# Patient Record
Sex: Male | Born: 1975 | Race: Black or African American | Hispanic: No | Marital: Married | Smoking: Current every day smoker
Health system: Southern US, Community
[De-identification: ages and names within clinical notes are randomized; demographics above are authoritative.]

## PROBLEM LIST (undated history)

## (undated) DIAGNOSIS — E119 Type 2 diabetes mellitus without complications: Secondary | ICD-10-CM

## (undated) DIAGNOSIS — E785 Hyperlipidemia, unspecified: Secondary | ICD-10-CM

## (undated) DIAGNOSIS — I10 Essential (primary) hypertension: Secondary | ICD-10-CM

## (undated) HISTORY — PX: TONSILLECTOMY: SUR1361

## (undated) HISTORY — PX: ADENOIDECTOMY: SUR15

---

## 2006-05-08 ENCOUNTER — Emergency Department (HOSPITAL_COMMUNITY): Admission: EM | Admit: 2006-05-08 | Discharge: 2006-05-08 | Payer: Self-pay | Admitting: Emergency Medicine

## 2010-03-09 ENCOUNTER — Emergency Department (HOSPITAL_COMMUNITY): Admission: EM | Admit: 2010-03-09 | Discharge: 2010-03-09 | Payer: Self-pay | Admitting: Emergency Medicine

## 2010-03-18 ENCOUNTER — Ambulatory Visit (HOSPITAL_COMMUNITY)
Admission: RE | Admit: 2010-03-18 | Discharge: 2010-03-18 | Payer: Self-pay | Admitting: Physical Medicine and Rehabilitation

## 2010-09-17 ENCOUNTER — Ambulatory Visit: Payer: Self-pay | Admitting: Gastroenterology

## 2010-09-17 DIAGNOSIS — K5732 Diverticulitis of large intestine without perforation or abscess without bleeding: Secondary | ICD-10-CM | POA: Insufficient documentation

## 2010-10-12 ENCOUNTER — Ambulatory Visit: Payer: Self-pay | Admitting: Gastroenterology

## 2010-10-12 ENCOUNTER — Ambulatory Visit (HOSPITAL_COMMUNITY): Admission: RE | Admit: 2010-10-12 | Discharge: 2010-10-12 | Payer: Self-pay | Admitting: Gastroenterology

## 2010-10-16 ENCOUNTER — Encounter: Payer: Self-pay | Admitting: Gastroenterology

## 2011-01-26 NOTE — Letter (Signed)
Summary: TCS ORDER  TCS ORDER   Imported By: Ave Filter 09/17/2010 15:08:53  _____________________________________________________________________  External Attachment:    Type:   Image     Comment:   External Document

## 2011-01-26 NOTE — Assessment & Plan Note (Signed)
Summary: DIVERTICULITIS #1   Visit Type:  Initial Consult Primary Care Provider:  Barbara Cower, M.D.  Chief Complaint:  diverticulitis #1.  History of Present Illness: Went to ED with R sided abd pain 2 weeks ago. Rx: CIP/?FLAG for 10 days. No nausea, vomtiing, fever, or chills. Was a little constipated. No diarrhea. No ASA, BC, Goody's, Ibuprofen, or Motrin. Rare ALeve for HAs. No problems swallowing, or heartburn. Weight loss: 7 lbs intentionally. Appetite: stays hungry. Been on a liquid diet and now eating food.  [facility] BM: 2x/day.  Preventive Screening-Counseling & Management  Alcohol-Tobacco     Smoking Status: current  Current Medications (verified): 1)  Losartan Potassium-Hctz 100-12.5 Mg Tabs (Losartan Potassium-Hctz) .... Take 1 Tablet By Mouth Once A Day 2)  Amlodipine Besylate 5 Mg Tabs (Amlodipine Besylate) .... Take 1 Tablet By Mouth Once A Day 3)  Lortab 5-500 Mg Tabs (Hydrocodone-Acetaminophen) .... Take One-Two Tablets Every 4 Hours As Needed 4)  Cipro 500 Mg Tabs (Ciprofloxacin Hcl) .... Take 1 Tablet By Mouth Two Times A Day  Allergies (verified): No Known Drug Allergies  Past History:  Past Medical History: Hypertension  Past Surgical History: Tonsillectomy/adenoids Wisdom Teeth extraction  Family History: No FH of Colon Cancer or polyps  Social History: Relationship for 9 years No kids Patient currently smokes. 1pk q2 days and now 1 cig/day. Alcohol Use - yes-1x/q6 mos Occupation: drives fork lift Smoking Status:  current  Review of Systems       Per HPI otherwise all systems negative.  Vital Signs:  Patient profile:   35 year old male Height:      72 inches Weight:      324 pounds BMI:     44.10 Temp:     98.0 degrees F oral Pulse rate:   80 / minute BP sitting:   134 / 80  (left arm) Cuff size:   regular  Vitals Entered By: West Bali MD (September 17, 2010 3:05 PM)  Nutrition Counseling: Patient's BMI is greater than 25 and therefore  counseled on weight management options.  Physical Exam  General:  Well developed, well nourished, no acute distress. Head:  Normocephalic and atraumatic. Eyes:  PERRL, no icterus. Mouth:  No deformity or lesions. Neck:  Supple; no masses. Lungs:  Clear throughout to auscultation. Heart:  Regular rate and rhythm; no murmurs. Abdomen:  Soft, nontender and nondistended. Normal bowel sounds. Extremities:  No edema noted. Neurologic:  Alert and  oriented x4;  grossly normal neurologically.  Impression & Recommendations:  Problem # 1:  DIVERTICULITIS, ACUTE (ICD-562.11) Assessment Improved Pt report right sided pain/?diverticulitis. CT not available. Obtainreport. Complete CIP. TCS in 2-3 weeks. OPV after TCS.  CC: PCP  Appended Document: Orders Update    Clinical Lists Changes  Orders: Added new Service order of Consultation Level III [id number]) - Signed      Appended Document: DIVERTICULITIS #1 [facility] SEP 2011 CT A/P W/ CM THKND CECUM, NL TI  Appended Document: DIVERTICULITIS #27 Aug 2010 CR 1.26 HB 14.8 PLT 194

## 2011-01-26 NOTE — Letter (Signed)
Summary: WORK NOTE  WORK NOTE   Imported By: Rexene Alberts 10/16/2010 09:07:50  _____________________________________________________________________  External Attachment:    Type:   Image     Comment:   External Document

## 2011-05-16 ENCOUNTER — Emergency Department (HOSPITAL_COMMUNITY)
Admission: EM | Admit: 2011-05-16 | Discharge: 2011-05-16 | Discharge status: Against Medical Advice | Payer: Self-pay | Attending: Emergency Medicine | Admitting: Emergency Medicine

## 2011-05-16 DIAGNOSIS — M549 Dorsalgia, unspecified: Secondary | ICD-10-CM | POA: Insufficient documentation

## 2011-05-16 DIAGNOSIS — X58XXXA Exposure to other specified factors, initial encounter: Secondary | ICD-10-CM | POA: Insufficient documentation

## 2011-05-16 DIAGNOSIS — T148XXA Other injury of unspecified body region, initial encounter: Secondary | ICD-10-CM | POA: Insufficient documentation

## 2013-01-19 ENCOUNTER — Encounter (HOSPITAL_COMMUNITY): Payer: Self-pay | Admitting: *Deleted

## 2013-01-19 ENCOUNTER — Emergency Department (HOSPITAL_COMMUNITY)
Admission: EM | Admit: 2013-01-19 | Discharge: 2013-01-19 | Disposition: A | Discharge status: Home / Self Care | Payer: Self-pay | Attending: Emergency Medicine | Admitting: Emergency Medicine

## 2013-01-19 DIAGNOSIS — F17200 Nicotine dependence, unspecified, uncomplicated: Secondary | ICD-10-CM | POA: Insufficient documentation

## 2013-01-19 DIAGNOSIS — Z79899 Other long term (current) drug therapy: Secondary | ICD-10-CM | POA: Insufficient documentation

## 2013-01-19 DIAGNOSIS — J069 Acute upper respiratory infection, unspecified: Secondary | ICD-10-CM | POA: Insufficient documentation

## 2013-01-19 DIAGNOSIS — I10 Essential (primary) hypertension: Secondary | ICD-10-CM | POA: Insufficient documentation

## 2013-01-19 HISTORY — DX: Essential (primary) hypertension: I10

## 2013-01-19 NOTE — ED Notes (Signed)
Pt alert & oriented x4, stable gait. Patient given discharge instructions, paperwork & prescription(s). Patient  instructed to stop at the registration desk to finish any additional paperwork. Patient verbalized understanding. Pt left department w/ no further questions. 

## 2013-01-19 NOTE — ED Notes (Signed)
Fever intermittently since Saturday ,now sore throat.  Cough,  Chills,  Body aches

## 2013-01-19 NOTE — ED Provider Notes (Signed)
History   This chart was scribed for Flint Melter, MD, by Frederik Pear, ER scribe. The patient was seen in room APA04/APA04 and the patient's care was started at 2225.    CSN: [id number]  Arrival date & time 01/19/13  2155   First MD Initiated Contact with Patient 01/19/13 2225      Chief Complaint  Patient presents with  . Sore Throat    (Consider location/radiation/quality/duration/timing/severity/associated sxs/prior treatment) HPI Grant Anderson is a 37 y.o. male with a h/o of hypertension who presents to the Emergency Department complaining of a constant, sudden onset, moderate sore throat with pain and difficulty swallowing with associated coughing, intermittent low-grade fever, chills, and myalgias that began 7 days ago. He denies any sneezing for facial pain and swelling. He has a surgical h/o of a tonsillectomy. He reports several sick contacts with similar symptoms.  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 He denies having a current flu shot.  Past Medical History  Diagnosis Date  . Hypertension     Past Surgical History  Procedure Date  . Tonsillectomy   . Adenoidectomy     History reviewed. No pertinent family history.  History  Substance Use Topics  . Smoking status: Current Every Day Smoker  . Smokeless tobacco: Not on file  . Alcohol Use: Yes      Review of Systems A complete 10 system review of systems was obtained and all systems are negative except as noted in the HPI and PMH.  Allergies  Review of patient's allergies indicates no known allergies.  Home Medications   Current Outpatient Rx  Name  Route  Sig  Dispense  Refill  . ACETAMINOPHEN 500 MG PO TABS   Oral   Take 1,000 mg by mouth 3 (three) times daily as needed. For pain/fever         . CHLORPHENIRAMINE-ACETAMINOPHEN 2-325 MG PO TABS   Oral   Take 2 tablets by mouth daily as needed. For symptoms         . LOSARTAN POTASSIUM-HCTZ 100-12.5  MG PO TABS   Oral   Take 1 tablet by mouth daily.           BP 171/101  Pulse 64  Temp 99.1 F (37.3 C) (Oral)  Resp 20  Ht 6\' 2"  (1.88 m)  Wt 320 lb (145.151 kg)  BMI 41.09 kg/m2  SpO2 100%  Physical Exam  Nursing note and vitals reviewed. Constitutional: He is oriented to person, place, and time. He appears well-developed and well-nourished.  HENT:  Head: Normocephalic and atraumatic.  Right Ear: External ear normal.  Left Ear: External ear normal.  Nose: Nose normal.  Mouth/Throat: Posterior oropharyngeal erythema present.  Eyes: Conjunctivae normal and EOM are normal. Pupils are equal, round, and reactive to light.  Neck: Normal range of motion and phonation normal. Neck supple.  Cardiovascular: Normal rate, regular rhythm, normal heart sounds and intact distal pulses.   Pulmonary/Chest: Effort normal and breath sounds normal. He exhibits no bony tenderness.  Abdominal: Soft. Normal appearance. There is no tenderness.  Musculoskeletal: Normal range of motion.  Lymphadenopathy:    He has no cervical adenopathy.  Neurological: He is alert and oriented to person, place, and time. He has normal strength. No cranial nerve deficit or sensory deficit. He exhibits normal muscle tone. Coordination normal.  Skin: Skin is warm, dry and intact.  Psychiatric: He has a normal mood and affect. His behavior is normal. Judgment and  thought content normal.    ED Course  Procedures (including critical care time)  DIAGNOSTIC STUDIES: Oxygen Saturation is 100% on room air, normal by my interpretation.    COORDINATION OF CARE:  22:29- Discussed planned course of treatment with the patient, including a rapid strep test, who is agreeable at this time.    Labs Reviewed  RAPID STREP SCREEN  LAB REPORT - SCANNED   Nursing notes, applicable records and vitals reviewed.  Radiologic Images/Reports reviewed.    1. Acute URI       MDM  Evaluation consistent with URI without  complicating factors. Doubt metabolic instability, serious bacterial infection or impending vascular collapse; the patient is stable for discharge.  I personally performed the services described in this documentation, which was scribed in my presence. The recorded information has been reviewed and is accurate.     Plan: Home Medications- OTC analgesics; Home Treatments- rest until; Recommended follow up- PCP in 3-4 days, if not, improved        Flint Melter, MD 01/21/13 2340

## 2013-05-21 ENCOUNTER — Emergency Department (HOSPITAL_COMMUNITY)
Admission: EM | Admit: 2013-05-21 | Discharge: 2013-05-21 | Disposition: A | Discharge status: Home / Self Care | Payer: Self-pay | Attending: Emergency Medicine | Admitting: Emergency Medicine

## 2013-05-21 ENCOUNTER — Encounter (HOSPITAL_COMMUNITY): Payer: Self-pay | Admitting: *Deleted

## 2013-05-21 DIAGNOSIS — Y939 Activity, unspecified: Secondary | ICD-10-CM | POA: Insufficient documentation

## 2013-05-21 DIAGNOSIS — W57XXXA Bitten or stung by nonvenomous insect and other nonvenomous arthropods, initial encounter: Secondary | ICD-10-CM | POA: Insufficient documentation

## 2013-05-21 DIAGNOSIS — S30861A Insect bite (nonvenomous) of abdominal wall, initial encounter: Secondary | ICD-10-CM

## 2013-05-21 DIAGNOSIS — F17200 Nicotine dependence, unspecified, uncomplicated: Secondary | ICD-10-CM | POA: Insufficient documentation

## 2013-05-21 DIAGNOSIS — R509 Fever, unspecified: Secondary | ICD-10-CM | POA: Insufficient documentation

## 2013-05-21 DIAGNOSIS — Z79899 Other long term (current) drug therapy: Secondary | ICD-10-CM | POA: Insufficient documentation

## 2013-05-21 DIAGNOSIS — I10 Essential (primary) hypertension: Secondary | ICD-10-CM | POA: Insufficient documentation

## 2013-05-21 DIAGNOSIS — S30860A Insect bite (nonvenomous) of lower back and pelvis, initial encounter: Secondary | ICD-10-CM | POA: Insufficient documentation

## 2013-05-21 DIAGNOSIS — Y929 Unspecified place or not applicable: Secondary | ICD-10-CM | POA: Insufficient documentation

## 2013-05-21 MED ORDER — DOXYCYCLINE HYCLATE 100 MG PO TABS
100.0000 mg | ORAL_TABLET | Freq: Once | ORAL | Status: AC
Start: 2013-05-21 — End: 2013-05-21
  Administered 2013-05-21: 100 mg via ORAL
  Filled 2013-05-21: qty 1

## 2013-05-21 MED ORDER — MINOCYCLINE HCL 100 MG PO CAPS: 100.0000 mg | ORAL_CAPSULE | Freq: Two times a day (BID) | ORAL | Status: DC

## 2013-05-21 MED ORDER — DILTIAZEM HCL 30 MG PO TABS
ORAL_TABLET | ORAL | Status: AC
  Administered 2013-05-21: 30 mg
  Filled 2013-05-21: qty 1

## 2013-05-21 MED ORDER — LOSARTAN POTASSIUM-HCTZ 100-12.5 MG PO TABS: 1.0000 | ORAL_TABLET | Freq: Every day | ORAL | Status: DC

## 2013-05-21 NOTE — ED Notes (Signed)
Pt has small area rt mid abd that he removed a tick from. Now pt says it has been itching and he thinks may be infected

## 2013-05-21 NOTE — ED Provider Notes (Signed)
History     CSN: [id number]  Arrival date & time 05/21/13  2023   First MD Initiated Contact with Patient 05/21/13 2035      Chief Complaint  Patient presents with  . Wound Infection    (Consider location/radiation/quality/duration/timing/severity/associated sxs/prior treatment) HPI Comments: Patient sustained a tick bite about 3 days ago. The patient presents to the emergency department at this time because the area has gotten larger and redder. The patient states that this early a.m. there was some" clear stuff" coming out of one of the bite sites. The patient thinks he may have had some fever, however he did not measure 1 with a thermometer. The patient also states he had an episode of nausea vomiting on yesterday. The patient presents now for evaluation of this bite.  The history is provided by the patient.    Past Medical History  Diagnosis Date  . Hypertension     Past Surgical History  Procedure Laterality Date  . Tonsillectomy    . Adenoidectomy      No family history on file.  History  Substance Use Topics  . Smoking status: Current Every Day Smoker  . Smokeless tobacco: Not on file  . Alcohol Use: Yes      Review of Systems  Constitutional: Negative for activity change.       All ROS Neg except as noted in HPI  HENT: Negative for nosebleeds and neck pain.   Eyes: Negative for photophobia and discharge.  Respiratory: Negative for cough, shortness of breath and wheezing.   Cardiovascular: Negative for chest pain and palpitations.  Gastrointestinal: Negative for abdominal pain and blood in stool.  Genitourinary: Negative for dysuria, frequency and hematuria.  Musculoskeletal: Negative for back pain and arthralgias.  Skin: Negative.   Neurological: Negative for dizziness, seizures and speech difficulty.  Psychiatric/Behavioral: Negative for hallucinations and confusion.    Allergies  Review of patient's allergies indicates no known allergies.  Home  Medications   Current Outpatient Rx  Name  Route  Sig  Dispense  Refill  . diphenhydrAMINE (BENADRYL) 25 mg capsule   Oral   Take 25 mg by mouth every 6 (six) hours as needed for itching, allergies or sleep.         Marland Kitchen ibuprofen (ADVIL,MOTRIN) 200 MG tablet   Oral   Take 200 mg by mouth every 6 (six) hours as needed for pain.         Marland Kitchen losartan-hydrochlorothiazide (HYZAAR) 100-12.5 MG per tablet   Oral   Take 1 tablet by mouth daily.         Marland Kitchen losartan-hydrochlorothiazide (HYZAAR) 100-12.5 MG per tablet   Oral   Take 1 tablet by mouth daily.   30 tablet   0   . minocycline (MINOCIN) 100 MG capsule   Oral   Take 1 capsule (100 mg total) by mouth 2 (two) times daily.   10 capsule   0     BP 154/113  Pulse 96  Temp(Src) 98.6 F (37 C) (Oral)  Resp 20  Ht 6\' 2"  (1.88 m)  Wt 315 lb (142.883 kg)  BMI 40.43 kg/m2  SpO2 100%  Physical Exam  Nursing note and vitals reviewed. Constitutional: He is oriented to person, place, and time. He appears well-developed and well-nourished.  Non-toxic appearance.  HENT:  Head: Normocephalic.  Right Ear: Tympanic membrane and external ear normal.  Left Ear: Tympanic membrane and external ear normal.  Eyes: EOM and lids are normal. Pupils are equal,  round, and reactive to light.  Neck: Normal range of motion. Neck supple. Carotid bruit is not present.  Cardiovascular: Normal rate, regular rhythm, normal heart sounds, intact distal pulses and normal pulses.   Pulmonary/Chest: Breath sounds normal. No respiratory distress.  Abdominal: Soft. Bowel sounds are normal. There is no tenderness. There is no guarding.  There is a 3.7 cm slightly raised mildly reddened area of the right lower abdomen. There are 3 small denuded areas one with a partial scab. There is no red streaks of the abdomen.  Musculoskeletal: Normal range of motion.  Lymphadenopathy:       Head (right side): No submandibular adenopathy present.       Head (left side):  No submandibular adenopathy present.    He has no cervical adenopathy.  Neurological: He is alert and oriented to person, place, and time. He has normal strength. No cranial nerve deficit or sensory deficit.  Skin: Skin is warm and dry.  Psychiatric: He has a normal mood and affect. His speech is normal.    ED Course  Procedures (including critical care time)  Labs Reviewed - No data to display No results found.   1. Tick bite of abdomen, initial encounter   2. Hypertension       MDM  I have reviewed nursing notes, vital signs, and all appropriate lab and imaging results for this patient. Patient sustained a tick bite approximately 3 days ago. He now has 3 scabbed or denuded areas of the lower abdomen. There is some increase in redness present. The patient also had an episode of vomiting and thinks that his may have had some temperature elevation on yesterday.  The vital signs are stable with exception of the blood pressure being significantly elevated at 154/113. The patient states he has not had his medication for a few months, because he no longer has insurance and could not go see his doctor.  Plan at this time is for the patient to apply Neosporin to the affected area on the abdomen 2 times daily. He is given a prescription for minocycline 2 times daily, the patient is given a prescription for Hyzaar 100 mg/12.5 mg for his blood pressure, with very strict instructions to see his physician or followup at the health Department concerning his blood pressure.       Kathie Dike, PA-C 05/21/13 2143

## 2013-05-21 NOTE — ED Notes (Signed)
wounf on right lower abdomen after removing a tick 3 days ago, states area is getting larger

## 2013-05-21 NOTE — ED Notes (Signed)
Has a history of htn and has not taken meds recently due to lack of insurance, advised he could be seen at the health department and he could get meds for bp at [facility]

## 2013-05-21 NOTE — ED Provider Notes (Signed)
Medical screening examination/treatment/procedure(s) were performed by non-physician practitioner and as supervising physician I was immediately available for consultation/collaboration.   Arsalan Brisbin, MD 05/21/13 2235 

## 2013-10-05 ENCOUNTER — Encounter (HOSPITAL_COMMUNITY): Payer: Self-pay | Admitting: Emergency Medicine

## 2013-10-05 ENCOUNTER — Emergency Department (HOSPITAL_COMMUNITY)
Admission: EM | Admit: 2013-10-05 | Discharge: 2013-10-05 | Disposition: A | Discharge status: Home / Self Care | Payer: 59 | Attending: Emergency Medicine | Admitting: Emergency Medicine

## 2013-10-05 ENCOUNTER — Emergency Department (HOSPITAL_COMMUNITY): Payer: 59

## 2013-10-05 DIAGNOSIS — Z792 Long term (current) use of antibiotics: Secondary | ICD-10-CM | POA: Insufficient documentation

## 2013-10-05 DIAGNOSIS — Y929 Unspecified place or not applicable: Secondary | ICD-10-CM | POA: Insufficient documentation

## 2013-10-05 DIAGNOSIS — S46911A Strain of unspecified muscle, fascia and tendon at shoulder and upper arm level, right arm, initial encounter: Secondary | ICD-10-CM

## 2013-10-05 DIAGNOSIS — X500XXA Overexertion from strenuous movement or load, initial encounter: Secondary | ICD-10-CM | POA: Insufficient documentation

## 2013-10-05 DIAGNOSIS — IMO0002 Reserved for concepts with insufficient information to code with codable children: Secondary | ICD-10-CM | POA: Insufficient documentation

## 2013-10-05 DIAGNOSIS — Y9372 Activity, wrestling: Secondary | ICD-10-CM | POA: Insufficient documentation

## 2013-10-05 DIAGNOSIS — I10 Essential (primary) hypertension: Secondary | ICD-10-CM | POA: Insufficient documentation

## 2013-10-05 DIAGNOSIS — F17200 Nicotine dependence, unspecified, uncomplicated: Secondary | ICD-10-CM | POA: Insufficient documentation

## 2013-10-05 DIAGNOSIS — Z79899 Other long term (current) drug therapy: Secondary | ICD-10-CM | POA: Insufficient documentation

## 2013-10-05 MED ORDER — HYDROCODONE-ACETAMINOPHEN 5-325 MG PO TABS: ORAL_TABLET | ORAL | Status: DC

## 2013-10-05 MED ORDER — IBUPROFEN 800 MG PO TABS: 800.0000 mg | ORAL_TABLET | Freq: Three times a day (TID) | ORAL | Status: DC

## 2013-10-05 MED ORDER — IBUPROFEN 800 MG PO TABS
800.0000 mg | ORAL_TABLET | Freq: Once | ORAL | Status: AC
  Administered 2013-10-05: 800 mg via ORAL
  Filled 2013-10-05: qty 1

## 2013-10-05 NOTE — ED Notes (Signed)
Patient reports was wrestling with girlfriend approximately a week ago and heard elbow pop. Reports pain since then to right elbow.

## 2013-10-05 NOTE — ED Notes (Signed)
Patient reports history of hypertension and forgot to take blood pressure medication today.

## 2013-10-05 NOTE — ED Notes (Signed)
Pain rt elbow, Pain increases with movement.  No swelling,  Says he was " wrestling " with his girlfriend and injured his elbow.

## 2013-10-05 NOTE — ED Provider Notes (Signed)
CSN: [id number]     Arrival date & time 10/05/13  2027 History   First MD Initiated Contact with Patient 10/05/13 2035     Chief Complaint  Patient presents with  . Elbow Pain   (Consider location/radiation/quality/duration/timing/severity/associated sxs/prior Treatment) HPI Comments: EYDAN CHIANESE is a 37 y.o. male who presents to the Emergency Department complaining of right elbow pain for one week. He states the pain began when he was forced playing with his girlfriend and heard and felt a" pop" to his elbow. Pain is worse with full extension and full flexion and improves with rest. He is trying over the counter medication without relief. He also states that he does a lot of heavy lifting and physical work. He denies swelling, redness, skin injury or rash, numbness or weakness to the right arm, wrist pain or shoulder pain.   The history is provided by the patient.    Past Medical History  Diagnosis Date  . Hypertension    Past Surgical History  Procedure Laterality Date  . Tonsillectomy    . Adenoidectomy     History reviewed. No pertinent family history. History  Substance Use Topics  . Smoking status: Current Every Day Smoker  . Smokeless tobacco: Not on file  . Alcohol Use: Yes     Comment: occasional    Review of Systems  Constitutional: Negative for fever and chills.  Respiratory: Negative for chest tightness and shortness of breath.   Cardiovascular: Negative for chest pain.  Musculoskeletal: Positive for arthralgias. Negative for joint swelling, neck pain and neck stiffness.  Skin: Negative for color change and wound.  Neurological: Negative for dizziness, weakness and numbness.  All other systems reviewed and are negative.    Allergies  Review of patient's allergies indicates no known allergies.  Home Medications   Current Outpatient Rx  Name  Route  Sig  Dispense  Refill  . diphenhydrAMINE (BENADRYL) 25 mg capsule   Oral   Take 25 mg by mouth  every 6 (six) hours as needed for itching, allergies or sleep.         Marland Kitchen ibuprofen (ADVIL,MOTRIN) 200 MG tablet   Oral   Take 200 mg by mouth every 6 (six) hours as needed for pain.         Marland Kitchen losartan-hydrochlorothiazide (HYZAAR) 100-12.5 MG per tablet   Oral   Take 1 tablet by mouth daily.         Marland Kitchen losartan-hydrochlorothiazide (HYZAAR) 100-12.5 MG per tablet   Oral   Take 1 tablet by mouth daily.   30 tablet   0   . minocycline (MINOCIN) 100 MG capsule   Oral   Take 1 capsule (100 mg total) by mouth 2 (two) times daily.   10 capsule   0    BP 162/106  Pulse 72  Temp(Src) 98.3 F (36.8 C) (Oral)  Resp 18  Ht 6\' 2"  (1.88 m)  Wt 298 lb (135.172 kg)  BMI 38.24 kg/m2  SpO2 99% Physical Exam  Nursing note and vitals reviewed. Constitutional: He is oriented to person, place, and time. He appears well-developed and well-nourished. No distress.  HENT:  Head: Normocephalic and atraumatic.  Neck: Normal range of motion. Neck supple.  Cardiovascular: Normal rate, regular rhythm, normal heart sounds and intact distal pulses.   No murmur heard. Pulmonary/Chest: Effort normal and breath sounds normal. No respiratory distress. He exhibits no tenderness.  Musculoskeletal: Normal range of motion. He exhibits tenderness. He exhibits no edema.  Right elbow: He exhibits normal range of motion, no swelling, no effusion, no deformity and no laceration. Tenderness found. Medial epicondyle tenderness noted.       Arms: Localized tenderness to palpation of the medial  epicondyle of the right elbow. Pain is reproduced with full flexion and full extension. Radial pulse is brisk, distal sensation intact, finger opposition is normal. Right wrist and shoulder are nontender. No edema or excessive warmth of the joint.  Neurological: He is alert and oriented to person, place, and time. He exhibits normal muscle tone. Coordination normal.  Skin: Skin is warm and dry.    ED Course    Procedures (including critical care time) Labs Review Labs Reviewed - No data to display   Imaging Review   Dg Elbow Complete Right  10/05/2013   CLINICAL DATA:  Right elbow pain after injury.  EXAM: RIGHT ELBOW - COMPLETE 3+ VIEW  COMPARISON:  None.  FINDINGS: There is no evidence of fracture, dislocation, or joint effusion. There is no evidence of arthropathy or other focal bone abnormality. Soft tissues are unremarkable  IMPRESSION: Negative.   Electronically Signed   By: Roque Lias M.D.   On: 10/05/2013 21:07        MDM   Localized tenderness of the right elbow secondary to injury. No concerning symptoms for septic joint. Neurovascularly intact. X-rays were reviewed and discussed with the patient. He agrees to symptomatic treatment and orthopedic followup if needed.  Patient is hypertensive this evening and takes Hyzaar for his blood pressure but has not taken his medication today. He denies any chest pain, dyspnea, headaches, or dizziness. Patient was advised of the importance of taking his medications daily and risks of uncontrolled hypertension.  Trayven Lumadue L. Trisha Mangle, PA-C 10/05/13 2120

## 2013-10-06 NOTE — ED Provider Notes (Signed)
Medical screening examination/treatment/procedure(s) were performed by non-physician practitioner and as supervising physician I was immediately available for consultation/collaboration.    Laveda Demedeiros D Naylee Frankowski, MD 10/06/13 0007 

## 2014-08-01 ENCOUNTER — Emergency Department (HOSPITAL_COMMUNITY)
Admission: EM | Admit: 2014-08-01 | Discharge: 2014-08-01 | Disposition: A | Discharge status: Home / Self Care | Payer: 59 | Attending: Emergency Medicine | Admitting: Emergency Medicine

## 2014-08-01 ENCOUNTER — Encounter (HOSPITAL_COMMUNITY): Payer: Self-pay | Admitting: Emergency Medicine

## 2014-08-01 DIAGNOSIS — J3489 Other specified disorders of nose and nasal sinuses: Secondary | ICD-10-CM | POA: Insufficient documentation

## 2014-08-01 DIAGNOSIS — I10 Essential (primary) hypertension: Secondary | ICD-10-CM | POA: Insufficient documentation

## 2014-08-01 DIAGNOSIS — H539 Unspecified visual disturbance: Secondary | ICD-10-CM | POA: Insufficient documentation

## 2014-08-01 DIAGNOSIS — H579 Unspecified disorder of eye and adnexa: Secondary | ICD-10-CM

## 2014-08-01 DIAGNOSIS — Z79899 Other long term (current) drug therapy: Secondary | ICD-10-CM | POA: Insufficient documentation

## 2014-08-01 DIAGNOSIS — F17200 Nicotine dependence, unspecified, uncomplicated: Secondary | ICD-10-CM | POA: Insufficient documentation

## 2014-08-01 MED ORDER — LOSARTAN POTASSIUM-HCTZ 100-12.5 MG PO TABS: 1.0000 | ORAL_TABLET | Freq: Every day | ORAL | Status: AC

## 2014-08-01 NOTE — ED Notes (Signed)
Pt c/o head/nasal congestion x 2 days. Denies cough. Pt states he sees flashes of light when he sneezes and states he has not taken his blood pressure medication x 2 weeks.

## 2014-08-01 NOTE — ED Provider Notes (Signed)
CSN: [id number]     Arrival date & time 08/01/14  [id number] History   First MD Initiated Contact with Patient 08/01/14 [phone]     Chief Complaint  Patient presents with  . Nasal Congestion     (Consider location/radiation/quality/duration/timing/severity/associated sxs/prior Treatment) Patient is a 38 y.o. male presenting with eye problem.  Eye Problem Location:  Both Quality: blurring. Severity:  Moderate Onset quality:  Gradual Duration:  2 days Timing:  Constant Progression:  Unchanged Chronicity:  New Context: not direct trauma and not foreign body   Relieved by:  Nothing Worsened by:  Nothing tried Ineffective treatments:  None tried Associated symptoms: blurred vision   Associated symptoms: no decreased vision, no double vision, no foreign body sensation, no headaches, no nausea, no numbness, no photophobia, no swelling, no tearing, no tingling and no vomiting   Risk factors: recent URI     Past Medical History  Diagnosis Date  . Hypertension    Past Surgical History  Procedure Laterality Date  . Tonsillectomy    . Adenoidectomy     No family history on file. History  Substance Use Topics  . Smoking status: Current Every Day Smoker -- 0.25 packs/day    Types: Cigarettes  . Smokeless tobacco: Not on file  . Alcohol Use: Yes     Comment: occasional    Review of Systems  Constitutional: Negative for fever and chills.  HENT: Negative for congestion, rhinorrhea and sore throat.   Eyes: Positive for blurred vision. Negative for double vision, photophobia and visual disturbance.  Respiratory: Negative for cough and shortness of breath.   Cardiovascular: Negative for chest pain and leg swelling.  Gastrointestinal: Negative for nausea, vomiting, abdominal pain, diarrhea and constipation.  Endocrine: Negative for polydipsia and polyuria.  Genitourinary: Negative for dysuria and hematuria.  Musculoskeletal: Negative for arthralgias and back pain.  Skin: Negative for color  change and rash.  Neurological: Negative for dizziness, tingling, syncope, light-headedness, numbness and headaches.  Hematological: Negative for adenopathy. Does not bruise/bleed easily.  All other systems reviewed and are negative.     Allergies  Review of patient's allergies indicates no known allergies.  Home Medications   Prior to Admission medications   Medication Sig Start Date End Date Taking? Authorizing Provider  ibuprofen (ADVIL,MOTRIN) 200 MG tablet Take 400 mg by mouth every 6 (six) hours as needed for fever.   Yes Historical Provider, MD  losartan-hydrochlorothiazide (HYZAAR) 100-12.5 MG per tablet Take 1 tablet by mouth daily. 08/01/14   Mirian MoMatthew Gentry, MD   BP 167/103  Pulse 62  Temp(Src) 98.3 F (36.8 C) (Oral)  Resp 16  Ht 6\' 3"  (1.905 m)  Wt 295 lb (133.811 kg)  BMI 36.87 kg/m2  SpO2 100% Physical Exam  Vitals reviewed. Constitutional: He is oriented to person, place, and time. He appears well-developed and well-nourished.  HENT:  Head: Normocephalic and atraumatic.  Eyes: Conjunctivae and EOM are normal.  Neck: Normal range of motion. Neck supple.  Cardiovascular: Normal rate, regular rhythm and normal heart sounds.   Pulmonary/Chest: Effort normal and breath sounds normal. No respiratory distress.  Abdominal: He exhibits no distension. There is no tenderness. There is no rebound and no guarding.  Musculoskeletal: Normal range of motion.  Neurological: He is alert and oriented to person, place, and time. He has normal strength and normal reflexes. No cranial nerve deficit or sensory deficit.  Skin: Skin is warm and dry.    ED Course  Procedures (including critical care time) Labs Review  Labs Reviewed - No data to display  Imaging Review No results found.   EKG Interpretation None      MDM   Final diagnoses:  Visual complaint    38 y.o. male  with pertinent PMH of HTN presents with blurring of vision while sneezing x2 days. Patient was in  his normal state of health until approximately 2 days ago when he began to develop URI symptoms consisting of runny nose, sneezing, and nonproductive cough. Patient denies fever, nausea, vomiting, chest pain, shortness of breath, any neurologic symptoms. Patient states that every time that he sneezes he has a sensation of mildly decreased vision and blurring.  He has been noncompliant with his hypertensive medication x6 days.  On arrival to ED vital signs as above consistent with hypertension. The patient denies any visual complaint and has no symptoms when not seizing. He states his vision is currently normal. Physical exam as above completely unremarkable. Consider likely source of symptoms to be hypertension. Patient was given strict return precautions for hypertensive urgency emergency, voiced understanding and agreed to followup. He is a with his primary care provider next Tuesday and he was provided a prescription for his antihypertensives while in the ED.  At this time do not suspect any intraocular pathology, however do feel is important that she follows up for his high blood pressure.  No signs of end organ damage at this time.    Labs and imaging as above reviewed.   1. Visual complaint         Mirian Mo, MD 08/01/14 [phone]

## 2014-08-01 NOTE — Discharge Instructions (Signed)
Blurred Vision You have been seen today complaining of blurred vision. This means you have a loss of ability to see small details.  CAUSES  Blurred vision can be a symptom of underlying eye problems, such as:  Aging of the eye (presbyopia).  Glaucoma.  Cataracts.  Eye infection.  Eye-related migraine.  Diabetes mellitus.  Fatigue.  Migraine headaches.  High blood pressure.  Breakdown of the back of the eye (macular degeneration).  Problems caused by some medications. The most common cause of blurred vision is the need for eyeglasses or a new prescription. Today in the emergency department, no cause for your blurred vision can be found. SYMPTOMS  Blurred vision is the loss of visual sharpness and detail (acuity). DIAGNOSIS  Should blurred vision continue, you should see your caregiver. If your caregiver is your primary care physician, he or she may choose to refer you to another specialist.  TREATMENT  Do not ignore your blurred vision. Make sure to have it checked out to see if further treatment or referral is necessary. SEEK MEDICAL CARE IF:  You are unable to get into a specialist so we can help you with a referral. SEEK IMMEDIATE MEDICAL CARE IF: You have severe eye pain, severe headache, or sudden loss of vision. MAKE SURE YOU:   Understand these instructions.  Will watch your condition.  Will get help right away if you are not doing well or get worse. Document Released: 12/16/2003 Document Revised: 03/06/2012 Document Reviewed: 07/17/2008 [facility] Patient Information 2015 [place], [place]. This information is not intended to replace advice given to you by your health care provider. Make sure you discuss any questions you have with your health care provider. Hypertension Hypertension, commonly called high blood pressure, is when the force of blood pumping through your arteries is too strong. Your arteries are the blood vessels that carry blood from your heart  throughout your body. A blood pressure reading consists of a higher number over a lower number, such as 110/72. The higher number (systolic) is the pressure inside your arteries when your heart pumps. The lower number (diastolic) is the pressure inside your arteries when your heart relaxes. Ideally you want your blood pressure below 120/80. Hypertension forces your heart to work harder to pump blood. Your arteries may become narrow or stiff. Having hypertension puts you at risk for heart disease, stroke, and other problems.  RISK FACTORS Some risk factors for high blood pressure are controllable. Others are not.  Risk factors you cannot control include:   Race. You may be at higher risk if you are African American.  Age. Risk increases with age.  Gender. Men are at higher risk than women before age 39 years. After age 39, women are at higher risk than men. Risk factors you can control include:  Not getting enough exercise or physical activity.  Being overweight.  Getting too much fat, sugar, calories, or salt in your diet.  Drinking too much alcohol. SIGNS AND SYMPTOMS Hypertension does not usually cause signs or symptoms. Extremely high blood pressure (hypertensive crisis) may cause headache, anxiety, shortness of breath, and nosebleed. DIAGNOSIS  To check if you have hypertension, your health care provider will measure your blood pressure while you are seated, with your arm held at the level of your heart. It should be measured at least twice using the same arm. Certain conditions can cause a difference in blood pressure between your right and left arms. A blood pressure reading that is higher than normal on  one occasion does not mean that you need treatment. If one blood pressure reading is high, ask your health care provider about having it checked again. TREATMENT  Treating high blood pressure includes making lifestyle changes and possibly taking medicine. Living a healthy lifestyle can  help lower high blood pressure. You may need to change some of your habits. Lifestyle changes may include:  Following the DASH diet. This diet is high in fruits, vegetables, and whole grains. It is low in salt, red meat, and added sugars.  Getting at least 2 hours of brisk physical activity every week.  Losing weight if necessary.  Not smoking.  Limiting alcoholic beverages.  Learning ways to reduce stress. If lifestyle changes are not enough to get your blood pressure under control, your health care provider may prescribe medicine. You may need to take more than one. Work closely with your health care provider to understand the risks and benefits. HOME CARE INSTRUCTIONS  Have your blood pressure rechecked as directed by your health care provider.   Take medicines only as directed by your health care provider. Follow the directions carefully. Blood pressure medicines must be taken as prescribed. The medicine does not work as well when you skip doses. Skipping doses also puts you at risk for problems.   Do not smoke.   Monitor your blood pressure at home as directed by your health care provider. SEEK MEDICAL CARE IF:   You think you are having a reaction to medicines taken.  You have recurrent headaches or feel dizzy.  You have swelling in your ankles.  You have trouble with your vision. SEEK IMMEDIATE MEDICAL CARE IF:  You develop a severe headache or confusion.  You have unusual weakness, numbness, or feel faint.  You have severe chest or abdominal pain.  You vomit repeatedly.  You have trouble breathing. MAKE SURE YOU:   Understand these instructions.  Will watch your condition.  Will get help right away if you are not doing well or get worse. Document Released: 12/13/2005 Document Revised: 04/29/2014 Document Reviewed: 10/05/2013 [facility] Patient Information 2015 [place], [place]. This information is not intended to replace advice given to you by your  health care provider. Make sure you discuss any questions you have with your health care provider.

## 2015-04-15 ENCOUNTER — Emergency Department (HOSPITAL_COMMUNITY)
Admission: EM | Admit: 2015-04-15 | Discharge: 2015-04-15 | Disposition: A | Discharge status: Home / Self Care | Payer: 59 | Attending: Emergency Medicine | Admitting: Emergency Medicine

## 2015-04-15 ENCOUNTER — Encounter (HOSPITAL_COMMUNITY): Payer: Self-pay | Admitting: Emergency Medicine

## 2015-04-15 DIAGNOSIS — I10 Essential (primary) hypertension: Secondary | ICD-10-CM | POA: Insufficient documentation

## 2015-04-15 DIAGNOSIS — G5602 Carpal tunnel syndrome, left upper limb: Secondary | ICD-10-CM | POA: Insufficient documentation

## 2015-04-15 DIAGNOSIS — Z79899 Other long term (current) drug therapy: Secondary | ICD-10-CM | POA: Insufficient documentation

## 2015-04-15 DIAGNOSIS — Z720 Tobacco use: Secondary | ICD-10-CM | POA: Insufficient documentation

## 2015-04-15 DIAGNOSIS — M25512 Pain in left shoulder: Secondary | ICD-10-CM

## 2015-04-15 MED ORDER — KETOROLAC TROMETHAMINE 10 MG PO TABS
10.0000 mg | ORAL_TABLET | Freq: Once | ORAL | Status: AC
Start: 2015-04-15 — End: 2015-04-15
  Administered 2015-04-15: 10 mg via ORAL
  Filled 2015-04-15: qty 1

## 2015-04-15 MED ORDER — MELOXICAM 15 MG PO TABS: 15.0000 mg | ORAL_TABLET | Freq: Every day | ORAL | Status: AC

## 2015-04-15 MED ORDER — PREDNISONE 50 MG PO TABS
60.0000 mg | ORAL_TABLET | Freq: Once | ORAL | Status: AC
  Administered 2015-04-15: 60 mg via ORAL
  Filled 2015-04-15 (×2): qty 1

## 2015-04-15 MED ORDER — DEXAMETHASONE 6 MG PO TABS: ORAL_TABLET | ORAL | Status: AC

## 2015-04-15 NOTE — Discharge Instructions (Signed)
Please call Dr. Hilda LiasKeeling for an appointment as soon as possible concerning your shoulder and possible carpal tunnel syndrome. Please use mobile daily with food. Please use Decadron 2 times daily with food. Shoulder Pain The shoulder is the joint that connects your arm to your body. Muscles and band-like tissues that connect bones to muscles (tendons) hold the joint together. Shoulder pain is felt if an injury or medical problem affects one or more parts of the shoulder. HOME CARE   Put ice on the sore area.  Put ice in a plastic bag.  Place a towel between your skin and the bag.  Leave the ice on for 15-20 minutes, 03-04 times a day for the first 2 days.  Stop using cold packs if they do not help with the pain.  If you were given something to keep your shoulder from moving (sling; shoulder immobilizer), wear it as told. Only take it off to shower or bathe.  Move your arm as little as possible, but keep your hand moving to prevent puffiness (swelling).  Squeeze a soft ball or foam pad as much as possible to help prevent swelling.  Take medicine as told by your doctor. GET HELP IF:  You have progressing new pain in your arm, hand, or fingers.  Your hand or fingers get cold.  Your medicine does not help lessen your pain. GET HELP RIGHT AWAY IF:   Your arm, hand, or fingers are numb or tingling.  Your arm, hand, or fingers are puffy (swollen), painful, or turn white or blue. MAKE SURE YOU:   Understand these instructions.  Will watch your condition.  Will get help right away if you are not doing well or get worse. Document Released: 05/31/2008 Document Revised: 04/29/2014 Document Reviewed: 06/26/2012 [facility] Patient Information 2015 [place], [place]. This information is not intended to replace advice given to you by your health care provider. Make sure you discuss any questions you have with your health care provider.  Carpal Tunnel Syndrome The carpal tunnel is an area under  the skin of the palm of your hand. Nerves, blood vessels, and strong tissues (tendons) pass through the tunnel. The tunnel can become puffy (swollen). If this happens, a nerve can be pinched in the wrist. This causes carpal tunnel syndrome.  HOME CARE  Take all medicine as told by your doctor.  If you were given a splint, wear it as told. Wear it at night or at times when your doctor told you to.  Rest your wrist from the activity that causes your pain.  Put ice on your wrist after long periods of wrist activity.  Put ice in a plastic bag.  Place a towel between your skin and the bag.  Leave the ice on for 15-20 minutes, 03-04 times a day.  Keep all doctor visits as told. GET HELP RIGHT AWAY IF:  You have new problems you cannot explain.  Your problems get worse and medicine does not help. MAKE SURE YOU:   Understand these instructions.  Will watch your condition.  Will get help right away if you are not doing well or get worse. Document Released: 12/02/2011 Document Revised: 03/06/2012 Document Reviewed: 12/02/2011 [facility] Patient Information 2015 [place], [place]. This information is not intended to replace advice given to you by your health care provider. Make sure you discuss any questions you have with your health care provider.

## 2015-04-15 NOTE — ED Provider Notes (Signed)
CSN: [id number]     Arrival date & time 04/15/15  1816 History   First MD Initiated Contact with Patient 04/15/15 1914     Chief Complaint  Patient presents with  . Shoulder Pain     (Consider location/radiation/quality/duration/timing/severity/associated sxs/prior Treatment) Patient is a 39 y.o. male presenting with shoulder pain. The history is provided by the patient.  Shoulder Pain Location:  Shoulder Shoulder location:  L shoulder Pain details:    Quality:  Aching and shooting   Radiates to:  L fingers   Severity:  Moderate   Onset quality:  Gradual   Duration:  2 weeks   Timing:  Intermittent   Progression:  Worsening Chronicity:  New Handedness:  Right-handed Dislocation: no   Prior injury to area:  No Relieved by:  Nothing Worsened by:  Movement Associated symptoms: tingling   Associated symptoms: no fever   Risk factors: no frequent fractures and no recent illness     Past Medical History  Diagnosis Date  . Hypertension    Past Surgical History  Procedure Laterality Date  . Tonsillectomy    . Adenoidectomy     History reviewed. No pertinent family history. History  Substance Use Topics  . Smoking status: Current Every Day Smoker -- 0.25 packs/day    Types: Cigarettes  . Smokeless tobacco: Not on file  . Alcohol Use: Yes     Comment: occasional    Review of Systems  Constitutional: Negative for fever.  Musculoskeletal: Positive for arthralgias.  Neurological:       Tingling left arm  All other systems reviewed and are negative.     Allergies  Review of patient's allergies indicates no known allergies.  Home Medications   Prior to Admission medications   Medication Sig Start Date End Date Taking? Authorizing Provider  ibuprofen (ADVIL,MOTRIN) 200 MG tablet Take 400 mg by mouth every 6 (six) hours as needed for fever.    Historical Provider, MD  losartan-hydrochlorothiazide (HYZAAR) 100-12.5 MG per tablet Take 1 tablet by mouth daily. 08/01/14    Mirian MoMatthew Gentry, MD   BP 149/94 mmHg  Pulse 70  Temp(Src) 98.2 F (36.8 C) (Oral)  Resp 18  Ht 6\' 2"  (1.88 m)  Wt 310 lb (140.615 kg)  BMI 39.78 kg/m2  SpO2 97% Physical Exam  Constitutional: He is oriented to person, place, and time. He appears well-developed and well-nourished.  Non-toxic appearance.  HENT:  Head: Normocephalic.  Right Ear: Tympanic membrane and external ear normal.  Left Ear: Tympanic membrane and external ear normal.  Eyes: EOM and lids are normal. Pupils are equal, round, and reactive to light.  Neck: Normal range of motion. Neck supple. Carotid bruit is not present.  Cardiovascular: Normal rate, regular rhythm, normal heart sounds, intact distal pulses and normal pulses.   Pulmonary/Chest: Breath sounds normal. No respiratory distress.  Abdominal: Soft. Bowel sounds are normal. There is no tenderness. There is no guarding.  Musculoskeletal: Normal range of motion.  There is crepitus with attempted range of motion of the left shoulder. There is no deformity of the shoulder. There is no hot joint noted. There is pain to palpation of the anterior left shoulder. There is full range of motion of the left elbow. There is a positive Tinel's sign on the left.  Full range of motion of the right upper extremity.  Lymphadenopathy:       Head (right side): No submandibular adenopathy present.       Head (left side): No submandibular  adenopathy present.    He has no cervical adenopathy.  Neurological: He is alert and oriented to person, place, and time. He has normal strength. No cranial nerve deficit or sensory deficit.  Skin: Skin is warm and dry.  Psychiatric: He has a normal mood and affect. His speech is normal.  Nursing note and vitals reviewed.   ED Course  Procedures (including critical care time) Labs Review Labs Reviewed - No data to display  Imaging Review No results found.   EKG Interpretation None      MDM  The examination suggest possible  rotator cuff irritation of the left shoulder, and positive Tinel's sign, consistent with carpal tunnel. The patient is referred to Dr. Hilda Lias for additional evaluation of this issue. Patient is fitted with a wrist splint to use until he is seen by orthopedics. The patient will use mobic daily with food.    Final diagnoses:  None    **I have reviewed nursing notes, vital signs, and all appropriate lab and imaging results for this patient.Ivery Quale, PA-C 04/15/15 1931  Raeford Razor, MD 04/15/15 [phone]

## 2015-04-15 NOTE — ED Notes (Signed)
Pain lt shoulder, no known injury, but does heavy lifting at work, tingling sensation in hand and arm. Good radial pulse and distal sensation

## 2015-04-15 NOTE — ED Notes (Signed)
Pt reports left shoulder pain x1 week. Pt reports left arm tingling x4 days. Pt denies any known injury but reports "lifts heavy things at work." no obvious deformity noted.

## 2015-05-15 ENCOUNTER — Ambulatory Visit (HOSPITAL_COMMUNITY)
Admission: RE | Admit: 2015-05-15 | Discharge: 2015-05-15 | Disposition: A | Discharge status: Home / Self Care | Payer: PRIVATE HEALTH INSURANCE | Source: Ambulatory Visit | Attending: Orthopaedic Surgery | Admitting: Orthopaedic Surgery

## 2015-05-15 ENCOUNTER — Other Ambulatory Visit (HOSPITAL_COMMUNITY): Payer: Self-pay | Admitting: Orthopaedic Surgery

## 2015-05-15 DIAGNOSIS — M25512 Pain in left shoulder: Secondary | ICD-10-CM | POA: Insufficient documentation

## 2015-05-15 DIAGNOSIS — M5412 Radiculopathy, cervical region: Secondary | ICD-10-CM | POA: Insufficient documentation

## 2015-07-12 ENCOUNTER — Emergency Department (HOSPITAL_COMMUNITY): Payer: PRIVATE HEALTH INSURANCE

## 2015-07-12 ENCOUNTER — Emergency Department (HOSPITAL_COMMUNITY)
Admission: EM | Admit: 2015-07-12 | Discharge: 2015-07-12 | Disposition: A | Discharge status: Home / Self Care | Payer: PRIVATE HEALTH INSURANCE | Attending: Emergency Medicine | Admitting: Emergency Medicine

## 2015-07-12 ENCOUNTER — Encounter (HOSPITAL_COMMUNITY): Payer: Self-pay | Admitting: Emergency Medicine

## 2015-07-12 DIAGNOSIS — Z791 Long term (current) use of non-steroidal anti-inflammatories (NSAID): Secondary | ICD-10-CM | POA: Insufficient documentation

## 2015-07-12 DIAGNOSIS — Z79899 Other long term (current) drug therapy: Secondary | ICD-10-CM | POA: Insufficient documentation

## 2015-07-12 DIAGNOSIS — I10 Essential (primary) hypertension: Secondary | ICD-10-CM | POA: Insufficient documentation

## 2015-07-12 DIAGNOSIS — Z720 Tobacco use: Secondary | ICD-10-CM | POA: Insufficient documentation

## 2015-07-12 DIAGNOSIS — J069 Acute upper respiratory infection, unspecified: Secondary | ICD-10-CM | POA: Insufficient documentation

## 2015-07-12 MED ORDER — ALBUTEROL SULFATE HFA 108 (90 BASE) MCG/ACT IN AERS
2.0000 | INHALATION_SPRAY | Freq: Once | RESPIRATORY_TRACT | Status: AC
  Administered 2015-07-12: 2 via RESPIRATORY_TRACT
  Filled 2015-07-12: qty 6.7

## 2015-07-12 MED ORDER — LORATADINE-PSEUDOEPHEDRINE ER 5-120 MG PO TB12: 1.0000 | ORAL_TABLET | Freq: Two times a day (BID) | ORAL | Status: AC

## 2015-07-12 MED ORDER — IBUPROFEN 800 MG PO TABS: 800.0000 mg | ORAL_TABLET | Freq: Three times a day (TID) | ORAL | Status: AC

## 2015-07-12 MED ORDER — IBUPROFEN 800 MG PO TABS
800.0000 mg | ORAL_TABLET | Freq: Once | ORAL | Status: AC
  Administered 2015-07-12: 800 mg via ORAL
  Filled 2015-07-12: qty 1

## 2015-07-12 MED ORDER — PROMETHAZINE-CODEINE 6.25-10 MG/5ML PO SYRP: 5.0000 mL | ORAL_SOLUTION | Freq: Four times a day (QID) | ORAL | Status: AC | PRN

## 2015-07-12 NOTE — ED Provider Notes (Signed)
CSN: [id number]     Arrival date & time 07/12/15  1119 History   First MD Initiated Contact with Patient 07/12/15 1128     Chief Complaint  Patient presents with  . Cough     (Consider location/radiation/quality/duration/timing/severity/associated sxs/prior Treatment) Patient is a 39 y.o. male presenting with cough. The history is provided by the patient.  Cough Cough characteristics:  Non-productive Severity:  Moderate Onset quality:  Gradual Duration:  1 day Timing:  Intermittent Progression:  Worsening Chronicity:  New Smoker: yes   Context: weather changes   Relieved by:  Nothing Worsened by:  Nothing tried Associated symptoms: myalgias and sinus congestion   Associated symptoms: no wheezing   Risk factors: no chemical exposure and no recent travel     Past Medical History  Diagnosis Date  . Hypertension    Past Surgical History  Procedure Laterality Date  . Tonsillectomy    . Adenoidectomy     History reviewed. No pertinent family history. History  Substance Use Topics  . Smoking status: Current Every Day Smoker -- 0.25 packs/day    Types: Cigarettes  . Smokeless tobacco: Not on file  . Alcohol Use: Yes     Comment: occasional    Review of Systems  Respiratory: Positive for cough. Negative for wheezing.   Musculoskeletal: Positive for myalgias.  All other systems reviewed and are negative.     Allergies  Review of patient's allergies indicates no known allergies.  Home Medications   Prior to Admission medications   Medication Sig Start Date End Date Taking? Authorizing Provider  dexamethasone (DECADRON) 6 MG tablet 1 po bid with food 04/15/15   Ivery QualeHobson Audrena Talaga, PA-C  ibuprofen (ADVIL,MOTRIN) 200 MG tablet Take 400 mg by mouth every 6 (six) hours as needed for fever.    Historical Provider, MD  losartan-hydrochlorothiazide (HYZAAR) 100-12.5 MG per tablet Take 1 tablet by mouth daily. 08/01/14   Mirian MoMatthew Gentry, MD  meloxicam (MOBIC) 15 MG tablet Take 1  tablet (15 mg total) by mouth daily. 04/15/15   Ivery QualeHobson Everest Brod, PA-C   BP 149/89 mmHg  Pulse 77  Temp(Src) 100 F (37.8 C)  Resp 20  Ht 6\' 2"  (1.88 m)  Wt 310 lb (140.615 kg)  BMI 39.78 kg/m2  SpO2 100% Physical Exam  Constitutional: He is oriented to person, place, and time. He appears well-developed and well-nourished.  Non-toxic appearance.  HENT:  Head: Normocephalic.  Right Ear: Tympanic membrane and external ear normal.  Left Ear: Tympanic membrane and external ear normal.  Eyes: EOM and lids are normal. Pupils are equal, round, and reactive to light.  Neck: Normal range of motion. Neck supple. Carotid bruit is not present.  Cardiovascular: Normal rate, regular rhythm, normal heart sounds, intact distal pulses and normal pulses.   Pulmonary/Chest: Breath sounds normal. No respiratory distress.  Abdominal: Soft. Bowel sounds are normal. There is no tenderness. There is no guarding.  Musculoskeletal: Normal range of motion.  Lymphadenopathy:       Head (right side): No submandibular adenopathy present.       Head (left side): No submandibular adenopathy present.    He has no cervical adenopathy.  Neurological: He is alert and oriented to person, place, and time. He has normal strength. No cranial nerve deficit or sensory deficit.  Skin: Skin is warm and dry.  Psychiatric: He has a normal mood and affect. His speech is normal.  Nursing note and vitals reviewed.   ED Course  Procedures (including critical care time)  Labs Review Labs Reviewed - No data to display  Imaging Review No results found.   EKG Interpretation None      MDM  Chest xray is negative for pneumonia or acute problem. Suspect URI. Rx for ibuprofen, claritinD and promethazine-codeine given to the patient. Pt to return to ED if any changes   Final diagnoses:  None    *I have reviewed nursing notes, vital signs, and all appropriate lab and imaging results for this patient.[address]  PA-C 07/15/15 1610  Gilda Crease, MD 07/17/15 [phone]

## 2015-07-12 NOTE — ED Notes (Signed)
Pt c./o congestion/dry cough since yesterday.

## 2015-07-12 NOTE — Discharge Instructions (Signed)
Upper Respiratory Infection, Adult An upper respiratory infection (URI) is also sometimes known as the common cold. The upper respiratory tract includes the nose, sinuses, throat, trachea, and bronchi. Bronchi are the airways leading to the lungs. Most people improve within 1 week, but symptoms can last up to 2 weeks. A residual cough may last even longer.  CAUSES Many different viruses can infect the tissues lining the upper respiratory tract. The tissues become irritated and inflamed and often become very moist. Mucus production is also common. A cold is contagious. You can easily spread the virus to others by oral contact. This includes kissing, sharing a glass, coughing, or sneezing. Touching your mouth or nose and then touching a surface, which is then touched by another person, can also spread the virus. SYMPTOMS  Symptoms typically develop 1 to 3 days after you come in contact with a cold virus. Symptoms vary from person to person. They may include:  Runny nose.  Sneezing.  Nasal congestion.  Sinus irritation.  Sore throat.  Loss of voice (laryngitis).  Cough.  Fatigue.  Muscle aches.  Loss of appetite.  Headache.  Low-grade fever. DIAGNOSIS  You might diagnose your own cold based on familiar symptoms, since most people get a cold 2 to 3 times a year. Your caregiver can confirm this based on your exam. Most importantly, your caregiver can check that your symptoms are not due to another disease such as strep throat, sinusitis, pneumonia, asthma, or epiglottitis. Blood tests, throat tests, and X-rays are not necessary to diagnose a common cold, but they may sometimes be helpful in excluding other more serious diseases. Your caregiver will decide if any further tests are required. RISKS AND COMPLICATIONS  You may be at risk for a more severe case of the common cold if you smoke cigarettes, have chronic heart disease (such as heart failure) or lung disease (such as asthma), or if  you have a weakened immune system. The very young and very old are also at risk for more serious infections. Bacterial sinusitis, middle ear infections, and bacterial pneumonia can complicate the common cold. The common cold can worsen asthma and chronic obstructive pulmonary disease (COPD). Sometimes, these complications can require emergency medical care and may be life-threatening. PREVENTION  The best way to protect against getting a cold is to practice good hygiene. Avoid oral or hand contact with people with cold symptoms. Wash your hands often if contact occurs. There is no clear evidence that vitamin C, vitamin E, echinacea, or exercise reduces the chance of developing a cold. However, it is always recommended to get plenty of rest and practice good nutrition. TREATMENT  Treatment is directed at relieving symptoms. There is no cure. Antibiotics are not effective, because the infection is caused by a virus, not by bacteria. Treatment may include:  Increased fluid intake. Sports drinks offer valuable electrolytes, sugars, and fluids.  Breathing heated mist or steam (vaporizer or shower).  Eating chicken soup or other clear broths, and maintaining good nutrition.  Getting plenty of rest.  Using gargles or lozenges for comfort.  Controlling fevers with ibuprofen or acetaminophen as directed by your caregiver.  Increasing usage of your inhaler if you have asthma. Zinc gel and zinc lozenges, taken in the first 24 hours of the common cold, can shorten the duration and lessen the severity of symptoms. Pain medicines may help with fever, muscle aches, and throat pain. A variety of non-prescription medicines are available to treat congestion and runny nose. Your caregiver   can make recommendations and may suggest nasal or lung inhalers for other symptoms.  HOME CARE INSTRUCTIONS   Only take over-the-counter or prescription medicines for pain, discomfort, or fever as directed by your  caregiver.  Use a warm mist humidifier or inhale steam from a shower to increase air moisture. This may keep secretions moist and make it easier to breathe.  Drink enough water and fluids to keep your urine clear or pale yellow.  Rest as needed.  Return to work when your temperature has returned to normal or as your caregiver advises. You may need to stay home longer to avoid infecting others. You can also use a face mask and careful hand washing to prevent spread of the virus. SEEK MEDICAL CARE IF:   After the first few days, you feel you are getting worse rather than better.  You need your caregiver's advice about medicines to control symptoms.  You develop chills, worsening shortness of breath, or brown or red sputum. These may be signs of pneumonia.  You develop yellow or brown nasal discharge or pain in the face, especially when you bend forward. These may be signs of sinusitis.  You develop a fever, swollen neck glands, pain with swallowing, or white areas in the back of your throat. These may be signs of strep throat. SEEK IMMEDIATE MEDICAL CARE IF:   You have a fever.  You develop severe or persistent headache, ear pain, sinus pain, or chest pain.  You develop wheezing, a prolonged cough, cough up blood, or have a change in your usual mucus (if you have chronic lung disease).  You develop sore muscles or a stiff neck. Document Released: 06/08/2001 Document Revised: 03/06/2012 Document Reviewed: 03/20/2014 [employer] Patient Information 2015 [employer]. This information is not intended to replace advice given to you by your health care provider. Make sure you discuss any questions you have with your health care provider.  

## 2015-09-03 ENCOUNTER — Encounter: Payer: Self-pay | Admitting: Gastroenterology

## 2016-05-04 ENCOUNTER — Other Ambulatory Visit (HOSPITAL_COMMUNITY): Payer: Self-pay | Admitting: Family Medicine

## 2016-05-04 DIAGNOSIS — M545 Low back pain: Secondary | ICD-10-CM

## 2016-05-07 ENCOUNTER — Ambulatory Visit (HOSPITAL_COMMUNITY)
Admission: RE | Admit: 2016-05-07 | Discharge: 2016-05-07 | Disposition: A | Discharge status: Home / Self Care | Payer: BLUE CROSS/BLUE SHIELD | Source: Ambulatory Visit | Attending: Family Medicine | Admitting: Family Medicine

## 2016-05-07 DIAGNOSIS — M545 Low back pain: Secondary | ICD-10-CM

## 2016-05-11 ENCOUNTER — Ambulatory Visit (HOSPITAL_COMMUNITY)
Admission: RE | Admit: 2016-05-11 | Discharge: 2016-05-11 | Disposition: A | Discharge status: Home / Self Care | Payer: BLUE CROSS/BLUE SHIELD | Source: Ambulatory Visit | Attending: Family Medicine | Admitting: Family Medicine

## 2016-05-11 ENCOUNTER — Other Ambulatory Visit (HOSPITAL_COMMUNITY): Payer: Self-pay | Admitting: Family Medicine

## 2016-05-11 DIAGNOSIS — M5127 Other intervertebral disc displacement, lumbosacral region: Secondary | ICD-10-CM | POA: Insufficient documentation

## 2016-05-11 DIAGNOSIS — M545 Low back pain: Secondary | ICD-10-CM

## 2017-06-13 ENCOUNTER — Other Ambulatory Visit: Payer: Self-pay | Admitting: Physician Assistant

## 2017-06-13 DIAGNOSIS — G8929 Other chronic pain: Secondary | ICD-10-CM

## 2017-06-13 DIAGNOSIS — M545 Low back pain: Principal | ICD-10-CM

## 2017-06-25 ENCOUNTER — Ambulatory Visit
Admission: RE | Admit: 2017-06-25 | Discharge: 2017-06-25 | Disposition: A | Discharge status: Home / Self Care | Payer: BLUE CROSS/BLUE SHIELD | Source: Ambulatory Visit | Attending: Physician Assistant | Admitting: Physician Assistant

## 2017-06-25 DIAGNOSIS — G8929 Other chronic pain: Secondary | ICD-10-CM

## 2017-06-25 DIAGNOSIS — M545 Low back pain, unspecified: Secondary | ICD-10-CM

## 2018-01-04 ENCOUNTER — Other Ambulatory Visit: Payer: Self-pay

## 2018-01-04 ENCOUNTER — Emergency Department (HOSPITAL_COMMUNITY)
Admission: EM | Admit: 2018-01-04 | Discharge: 2018-01-04 | Disposition: A | Discharge status: Home / Self Care | Payer: Self-pay | Attending: Emergency Medicine | Admitting: Emergency Medicine

## 2018-01-04 ENCOUNTER — Emergency Department (HOSPITAL_COMMUNITY): Payer: Self-pay

## 2018-01-04 ENCOUNTER — Encounter (HOSPITAL_COMMUNITY): Payer: Self-pay

## 2018-01-04 DIAGNOSIS — J209 Acute bronchitis, unspecified: Secondary | ICD-10-CM | POA: Insufficient documentation

## 2018-01-04 DIAGNOSIS — E785 Hyperlipidemia, unspecified: Secondary | ICD-10-CM | POA: Insufficient documentation

## 2018-01-04 DIAGNOSIS — F17210 Nicotine dependence, cigarettes, uncomplicated: Secondary | ICD-10-CM | POA: Insufficient documentation

## 2018-01-04 DIAGNOSIS — E119 Type 2 diabetes mellitus without complications: Secondary | ICD-10-CM | POA: Insufficient documentation

## 2018-01-04 DIAGNOSIS — Z79899 Other long term (current) drug therapy: Secondary | ICD-10-CM | POA: Insufficient documentation

## 2018-01-04 DIAGNOSIS — I10 Essential (primary) hypertension: Secondary | ICD-10-CM | POA: Insufficient documentation

## 2018-01-04 HISTORY — DX: Hyperlipidemia, unspecified: E78.5

## 2018-01-04 HISTORY — DX: Type 2 diabetes mellitus without complications: E11.9

## 2018-01-04 MED ORDER — ALBUTEROL SULFATE HFA 108 (90 BASE) MCG/ACT IN AERS: 1.0000 | INHALATION_SPRAY | Freq: Four times a day (QID) | RESPIRATORY_TRACT | 0 refills | Status: AC | PRN

## 2018-01-04 MED ORDER — ALBUTEROL SULFATE (2.5 MG/3ML) 0.083% IN NEBU
2.5000 mg | INHALATION_SOLUTION | Freq: Once | RESPIRATORY_TRACT | Status: AC
  Administered 2018-01-04: 2.5 mg via RESPIRATORY_TRACT
  Filled 2018-01-04: qty 3

## 2018-01-04 MED ORDER — BENZONATATE 100 MG PO CAPS: 100.0000 mg | ORAL_CAPSULE | Freq: Three times a day (TID) | ORAL | 0 refills | Status: AC

## 2018-01-04 NOTE — ED Triage Notes (Signed)
Pt reports cold symptoms for approx 2 weeks. Cough has increased and no longer to get any mucous up. Chest hurts from coughing. Cough keeping him up at night

## 2018-01-04 NOTE — ED Notes (Signed)
Advised to followup with PCP regarding bp

## 2018-01-04 NOTE — Discharge Instructions (Signed)
Your evaluated in the emergency department for cough and wheezing.  Your oxygenation number was normal and you also had a chest x-ray that showed no obvious pneumonia.  We are prescribing you an inhaler and a medication to take for cough.  You should follow-up with your doctor if any worsening symptoms.  Please limit your smoking while you are sick.

## 2018-01-04 NOTE — ED Provider Notes (Signed)
[facility] EMERGENCY DEPARTMENT Provider Note   CSN: [id number] Arrival date & time: 01/04/18  1238      History   Chief Complaint Chief Complaint  Patient presents with  . Cough    HPI Grant MateChristopher L Anderson is a 42 y.o. male.  HPI  42 year old male presents to the ED with a cough that has been getting worse for the last few days and associated with wheezing.  Patient started with upper respiratory infection about 2 weeks ago runny nose sore throat and a productive cough that has now settled down to this nonproductive cough that is keeping him up at night.  It was initially a fever but none recently.  He is a smoker.  He denies any chest pain abdominal pain nausea vomiting diarrhea or dysuria.  The cough is causing his chest to hurt.  Past Medical History:  Diagnosis Date  . Diabetes mellitus without complication (HCC)   . Hyperlipidemia   . Hypertension     Patient Active Problem List   Diagnosis Date Noted  . DIVERTICULITIS, ACUTE 09/17/2010    Past Surgical History:  Procedure Laterality Date  . ADENOIDECTOMY    . TONSILLECTOMY         Home Medications    Prior to Admission medications   Medication Sig Start Date End Date Taking? Authorizing Provider  acetaminophen (TYLENOL) 500 MG tablet Take 1,000 mg by mouth every 6 (six) hours as needed for fever.    [provider]  dexamethasone (DECADRON) 6 MG tablet 1 po bid with food Patient not taking: Reported on 07/12/2015 04/15/15   Ivery QualeBryant, Hobson, PA-C  ibuprofen (ADVIL,MOTRIN) 800 MG tablet Take 1 tablet (800 mg total) by mouth 3 (three) times daily. 07/12/15   Ivery QualeBryant, Hobson, PA-C  loratadine-pseudoephedrine (CLARITIN-D 12 HOUR) 5-120 MG per tablet Take 1 tablet by mouth 2 (two) times daily. 07/12/15   Ivery QualeBryant, Hobson, PA-C  losartan-hydrochlorothiazide (HYZAAR) 100-12.5 MG per tablet Take 1 tablet by mouth daily. 08/01/14   Mirian MoGentry, Matthew, MD  meloxicam (MOBIC) 15 MG tablet Take 1 tablet (15 mg total) by  mouth daily. Patient not taking: Reported on 07/12/2015 04/15/15   Ivery QualeBryant, Hobson, PA-C  Phenyleph-CPM-DM-Aspirin (ALKA-SELTZER PLUS COLD & COUGH PO) Take 2 capsules by mouth every 4 (four) hours as needed (cold/cough).    [provider]  promethazine-codeine (PHENERGAN WITH CODEINE) 6.25-10 MG/5ML syrup Take 5 mLs by mouth every 6 (six) hours as needed. 07/12/15   Ivery QualeBryant, Hobson, PA-C    Family History No family history on file.  Social History Social History   Tobacco Use  . Smoking status: Current Every Day Smoker    Packs/day: 0.25    Types: Cigarettes  Substance Use Topics  . Alcohol use: Yes    Comment: occasional  . Drug use: Yes    Types: Marijuana    Comment: last use 04/14/15     Allergies   Patient has no known allergies.   Review of Systems Review of Systems  Constitutional: Negative for fever.  HENT: Negative for sore throat.   Respiratory: Positive for cough. Negative for shortness of breath.   Cardiovascular: Negative for chest pain.  Gastrointestinal: Negative for abdominal pain.  Genitourinary: Negative for dysuria.  Skin: Negative for rash.     Physical Exam Updated Vital Signs BP (!) 160/102   Pulse 68   Temp 98.2 F (36.8 C) (Oral)   Resp 20   Ht 6\' 2"  (1.88 m)   Wt (!) 142.9 kg (315  lb)   SpO2 100%   BMI 40.44 kg/m   Physical Exam  Constitutional: He appears well-developed and well-nourished.  HENT:  Head: Normocephalic and atraumatic.  Right Ear: External ear normal.  Left Ear: External ear normal.  Nose: Nose normal.  Mouth/Throat: Oropharynx is clear and moist. No oropharyngeal exudate.  Eyes: Conjunctivae are normal.  Neck: Neck supple.  Cardiovascular: Normal rate, regular rhythm, normal heart sounds and intact distal pulses.  Pulmonary/Chest: Effort normal. He has wheezes (scattered wheeze, clears with cough).  Abdominal: Soft. There is no tenderness.  Musculoskeletal: Normal range of motion.  Neurological: He is  alert.  Skin: Skin is warm and dry.  Psychiatric: He has a normal mood and affect.  Nursing note and vitals reviewed.    ED Treatments / Results  Labs (all labs ordered are listed, but only abnormal results are displayed) Labs Reviewed - No data to display  EKG  EKG Interpretation None       Radiology Dg Chest 2 View  Result Date: 01/04/2018 CLINICAL DATA:  Cough and chest congestion for the past 2 weeks. Current smoker. EXAM: CHEST  2 VIEW COMPARISON:  Chest x-ray of July 12, 2015 FINDINGS: The lungs are adequately inflated. There is no focal infiltrate. There is no pleural effusion. The heart and pulmonary vascularity are normal. The mediastinum is normal in width. The bony thorax exhibits no acute abnormality. IMPRESSION: There is no pneumonia nor other acute cardiopulmonary abnormality. Electronically Signed   By: David  [place] M.D.   On: 01/04/2018 13:11    Procedures Procedures (including critical care time)  Medications Ordered in ED Medications  albuterol (PROVENTIL) (2.5 MG/3ML) 0.083% nebulizer solution 2.5 mg (2.5 mg Nebulization Given 01/04/18 1341)     Initial Impression / Assessment and Plan / ED Course  I have reviewed the triage vital signs and the nursing notes.  Pertinent labs & imaging results that were available during my care of the patient were reviewed by me and considered in my medical decision making (see chart for details).    42 year old smoker with recent URI here now with nonproductive cough and wheezing.  His pulse ox is normal and other than his elevated blood pressure is vitals are otherwise okay.  He is in no respiratory distress.  We will give him a neb here and he is used an MDI in the past so prescribe an MDI.  Will also prescribe him some cough medicine.  His chest x-ray here was negative for infiltrate.  Currently I do not feel he needs antibiotics.  We reviewed what to watch out for he understands to follow-up with his doctor or return if  any worsening symptoms.  Final Clinical Impressions(s) / ED Diagnoses   Final diagnoses:  Acute bronchitis, unspecified organism    ED Discharge Orders    None       Terrilee Files, MD 01/04/18 [phone]

## 2020-03-04 ENCOUNTER — Emergency Department (HOSPITAL_COMMUNITY)
Admission: EM | Admit: 2020-03-04 | Discharge: 2020-03-04 | Disposition: A | Discharge status: Home / Self Care | Payer: BLUE CROSS/BLUE SHIELD | Attending: Emergency Medicine | Admitting: Emergency Medicine

## 2020-03-04 ENCOUNTER — Emergency Department (HOSPITAL_COMMUNITY): Payer: BLUE CROSS/BLUE SHIELD

## 2020-03-04 ENCOUNTER — Other Ambulatory Visit: Payer: Self-pay

## 2020-03-04 ENCOUNTER — Encounter (HOSPITAL_COMMUNITY): Payer: Self-pay

## 2020-03-04 DIAGNOSIS — R197 Diarrhea, unspecified: Secondary | ICD-10-CM | POA: Insufficient documentation

## 2020-03-04 DIAGNOSIS — E119 Type 2 diabetes mellitus without complications: Secondary | ICD-10-CM | POA: Diagnosis not present

## 2020-03-04 DIAGNOSIS — K529 Noninfective gastroenteritis and colitis, unspecified: Secondary | ICD-10-CM | POA: Diagnosis not present

## 2020-03-04 DIAGNOSIS — F1210 Cannabis abuse, uncomplicated: Secondary | ICD-10-CM | POA: Diagnosis not present

## 2020-03-04 DIAGNOSIS — Z79899 Other long term (current) drug therapy: Secondary | ICD-10-CM | POA: Insufficient documentation

## 2020-03-04 DIAGNOSIS — I10 Essential (primary) hypertension: Secondary | ICD-10-CM | POA: Insufficient documentation

## 2020-03-04 DIAGNOSIS — Z20822 Contact with and (suspected) exposure to covid-19: Secondary | ICD-10-CM | POA: Insufficient documentation

## 2020-03-04 DIAGNOSIS — R1084 Generalized abdominal pain: Secondary | ICD-10-CM | POA: Diagnosis present

## 2020-03-04 DIAGNOSIS — F17210 Nicotine dependence, cigarettes, uncomplicated: Secondary | ICD-10-CM | POA: Insufficient documentation

## 2020-03-04 LAB — COMPREHENSIVE METABOLIC PANEL
ALT: 25 U/L (ref 0–44)
AST: 17 U/L (ref 15–41)
Albumin: 4.2 g/dL (ref 3.5–5.0)
Alkaline Phosphatase: 104 U/L (ref 38–126)
Anion gap: 9 (ref 5–15)
BUN: 15 mg/dL (ref 6–20)
CO2: 25 mmol/L (ref 22–32)
Calcium: 9.1 mg/dL (ref 8.9–10.3)
Chloride: 106 mmol/L (ref 98–111)
Creatinine, Ser: 1.26 mg/dL — ABNORMAL HIGH (ref 0.61–1.24)
GFR calc Af Amer: >60 mL/min (ref >60)
GFR calc non Af Amer: >60 mL/min (ref >60)
Glucose, Bld: 226 mg/dL — ABNORMAL HIGH (ref 70–99)
Potassium: 3.9 mmol/L (ref 3.5–5.1)
Sodium: 140 mmol/L (ref 135–145)
Total Bilirubin: 0.6 mg/dL (ref 0.3–1.2)
Total Protein: 7.5 g/dL (ref 6.5–8.1)

## 2020-03-04 LAB — CBC WITH DIFFERENTIAL/PLATELET
Abs Immature Granulocytes: 0.04 10*3/uL (ref 0.00–0.07)
Basophils Absolute: 0.1 10*3/uL (ref 0.0–0.1)
Basophils Relative: 1 %
Eosinophils Absolute: 0.7 10*3/uL — ABNORMAL HIGH (ref 0.0–0.5)
Eosinophils Relative: 9 %
HCT: 47.4 % (ref 39.0–52.0)
Hemoglobin: 15.3 g/dL (ref 13.0–17.0)
Immature Granulocytes: 1 %
Lymphocytes Relative: 26 %
Lymphs Abs: 2.2 10*3/uL (ref 0.7–4.0)
MCH: 29.4 pg (ref 26.0–34.0)
MCHC: 32.3 g/dL (ref 30.0–36.0)
MCV: 91.2 fL (ref 80.0–100.0)
Monocytes Absolute: 0.6 10*3/uL (ref 0.1–1.0)
Monocytes Relative: 7 %
Neutro Abs: 4.7 10*3/uL (ref 1.7–7.7)
Neutrophils Relative %: 56 %
Platelets: 210 10*3/uL (ref 150–400)
RBC: 5.2 MIL/uL (ref 4.22–5.81)
RDW: 13.3 % (ref 11.5–15.5)
WBC: 8.3 10*3/uL (ref 4.0–10.5)
nRBC: 0 % (ref 0.0–0.2)

## 2020-03-04 LAB — LIPASE, BLOOD: Lipase: 25 U/L (ref 11–51)

## 2020-03-04 MED ORDER — SODIUM CHLORIDE 0.9 % IV BOLUS
1000.0000 mL | Freq: Once | INTRAVENOUS | Status: AC
  Administered 2020-03-04: 1000 mL via INTRAVENOUS

## 2020-03-04 MED ORDER — LOPERAMIDE HCL 2 MG PO CAPS
4.0000 mg | ORAL_CAPSULE | Freq: Once | ORAL | Status: AC
  Administered 2020-03-04: 4 mg via ORAL
  Filled 2020-03-04: qty 2

## 2020-03-04 MED ORDER — ONDANSETRON 4 MG PO TBDP: ORAL_TABLET | ORAL | 0 refills | Status: AC

## 2020-03-04 NOTE — ED Provider Notes (Signed)
[facility] EMERGENCY DEPARTMENT Provider Note   CSN: [id number] Arrival date & time: 03/04/20  0730     History Chief Complaint  Patient presents with  . Abdominal Pain    Grant Anderson is a 44 y.o. male.  Patient complains of abdominal cramps and diarrhea and some nausea for few days.  No blood in his diarrhea  The history is provided by the patient. No language interpreter was used.  Abdominal Pain Pain location:  Generalized Pain quality: aching   Pain radiates to:  Does not radiate Pain severity:  Mild Onset quality:  Gradual Timing:  Constant Progression:  Waxing and waning Chronicity:  New Associated symptoms: diarrhea   Associated symptoms: no chest pain, no cough, no fatigue and no hematuria        Past Medical History:  Diagnosis Date  . Diabetes mellitus without complication (HCC)   . Hyperlipidemia   . Hypertension     Patient Active Problem List   Diagnosis Date Noted  . DIVERTICULITIS, ACUTE 09/17/2010    Past Surgical History:  Procedure Laterality Date  . ADENOIDECTOMY    . TONSILLECTOMY         No family history on file.  Social History   Tobacco Use  . Smoking status: Current Every Day Smoker    Packs/day: 0.25    Types: Cigarettes  . Smokeless tobacco: Never Used  Substance Use Topics  . Alcohol use: Yes    Comment: occasional  . Drug use: Yes    Types: Marijuana    Home Medications Prior to Admission medications   Medication Sig Start Date End Date Taking? Authorizing Provider  acetaminophen (TYLENOL) 500 MG tablet Take 1,000 mg by mouth every 6 (six) hours as needed for fever.    [provider]  albuterol (PROVENTIL HFA;VENTOLIN HFA) 108 (90 Base) MCG/ACT inhaler Inhale 1-2 puffs into the lungs every 6 (six) hours as needed for wheezing or shortness of breath. 01/04/18   Terrilee Files, MD  amLODipine (NORVASC) 10 MG tablet Take 10 mg by mouth daily. 12/03/19   [provider]  benazepril  (LOTENSIN) 40 MG tablet Take 40 mg by mouth daily. 12/03/19   [provider]  benzonatate (TESSALON) 100 MG capsule Take 1 capsule (100 mg total) by mouth every 8 (eight) hours. 01/04/18   Terrilee Files, MD  dexamethasone (DECADRON) 6 MG tablet 1 po bid with food Patient not taking: Reported on 07/12/2015 04/15/15   Ivery Quale, PA-C  ibuprofen (ADVIL,MOTRIN) 800 MG tablet Take 1 tablet (800 mg total) by mouth 3 (three) times daily. 07/12/15   Ivery Quale, PA-C  loratadine-pseudoephedrine (CLARITIN-D 12 HOUR) 5-120 MG per tablet Take 1 tablet by mouth 2 (two) times daily. 07/12/15   Ivery Quale, PA-C  losartan-hydrochlorothiazide (HYZAAR) 100-12.5 MG per tablet Take 1 tablet by mouth daily. 08/01/14   Mirian Mo, MD  meloxicam (MOBIC) 15 MG tablet Take 1 tablet (15 mg total) by mouth daily. Patient not taking: Reported on 07/12/2015 04/15/15   Ivery Quale, PA-C  ondansetron (ZOFRAN ODT) 4 MG disintegrating tablet 4mg  ODT q4 hours prn nausea/vomit 03/04/20   05/04/20, MD  oxyCODONE (OXY IR/ROXICODONE) 5 MG immediate release tablet Take 5 mg by mouth 4 (four) times daily. 02/18/20   [provider]  Phenyleph-CPM-DM-Aspirin (ALKA-SELTZER PLUS COLD & COUGH PO) Take 2 capsules by mouth every 4 (four) hours as needed (cold/cough).    [provider]  promethazine-codeine (PHENERGAN WITH CODEINE) 6.25-10 MG/5ML syrup  Take 5 mLs by mouth every 6 (six) hours as needed. 07/12/15   Ivery Quale, PA-C    Allergies    Patient has no known allergies.  Review of Systems   Review of Systems  Constitutional: Negative for appetite change and fatigue.  HENT: Negative for congestion, ear discharge and sinus pressure.   Eyes: Negative for discharge.  Respiratory: Negative for cough.   Cardiovascular: Negative for chest pain.  Gastrointestinal: Positive for abdominal pain and diarrhea.  Genitourinary: Negative for frequency and hematuria.  Musculoskeletal: Negative for  back pain.  Skin: Negative for rash.  Neurological: Negative for seizures and headaches.  Psychiatric/Behavioral: Negative for hallucinations.    Physical Exam Updated Vital Signs BP (!) 166/111 (BP Location: Left Arm)   Pulse 71   Temp 97.8 F (36.6 C) (Oral)   Resp 18   Ht 6\' 2"  (1.88 m)   Wt 136.1 kg   SpO2 98%   BMI 38.52 kg/m   Physical Exam Vitals and nursing note reviewed.  Constitutional:      Appearance: He is well-developed.  HENT:     Head: Normocephalic.     Mouth/Throat:     Mouth: Mucous membranes are moist.  Eyes:     General: No scleral icterus.    Conjunctiva/sclera: Conjunctivae normal.  Neck:     Thyroid: No thyromegaly.  Cardiovascular:     Rate and Rhythm: Normal rate and regular rhythm.     Heart sounds: No murmur. No friction rub. No gallop.   Pulmonary:     Breath sounds: No stridor. No wheezing or rales.  Chest:     Chest wall: No tenderness.  Abdominal:     General: There is no distension.     Tenderness: There is abdominal tenderness. There is no rebound.  Musculoskeletal:        General: Normal range of motion.     Cervical back: Neck supple.  Lymphadenopathy:     Cervical: No cervical adenopathy.  Skin:    Findings: No erythema or rash.  Neurological:     Mental Status: He is oriented to person, place, and time.     Motor: No abnormal muscle tone.     Coordination: Coordination normal.  Psychiatric:        Behavior: Behavior normal.     ED Results / Procedures / Treatments   Labs (all labs ordered are listed, but only abnormal results are displayed) Labs Reviewed  CBC WITH DIFFERENTIAL/PLATELET - Abnormal; Notable for the following components:      Result Value   Eosinophils Absolute 0.7 (*)    All other components within normal limits  COMPREHENSIVE METABOLIC PANEL - Abnormal; Notable for the following components:   Glucose, Bld 226 (*)    Creatinine, Ser 1.26 (*)    All other components within normal limits  LIPASE,  BLOOD    EKG None  Radiology DG ABD ACUTE 2+V W 1V CHEST  Result Date: 03/04/2020 CLINICAL DATA:  Pain with nausea, vomiting, and diarrhea EXAM: DG ABDOMEN ACUTE W/ 1V CHEST COMPARISON:  None. FINDINGS: PA chest: Lungs are clear. Heart is upper normal in size with pulmonary vascularity normal. No adenopathy. Supine and upright abdomen: There is no appreciable small or large bowel dilatation. There is moderate stool in the colon. There is moderate air and fluid in the stomach. No free air. No abnormal calcifications. IMPRESSION: Moderate fluid and air in stomach. No demonstrable bowel obstruction or free air. Lungs clear. Electronically Signed  By: Lowella Grip III M.D.   On: 03/04/2020 09:08    Procedures Procedures (including critical care time)  Medications Ordered in ED Medications  sodium chloride 0.9 % bolus 1,000 mL (1,000 mLs Intravenous New Bag/Given 03/04/20 0810)  loperamide (IMODIUM) capsule 4 mg (4 mg Oral Given 03/04/20 3536)    ED Course  I have reviewed the triage vital signs and the nursing notes.  Pertinent labs & imaging results that were available during my care of the patient were reviewed by me and considered in my medical decision making (see chart for details).    MDM Rules/Calculators/A&P                     Labs unremarkable except for mildly elevated glucose.  X-rays unremarkable.  Patient feels better with fluids and nausea medicine.  He is sent home to take Zofran and Imodium Tylenol and follow-up with his PCP.  Patient does have mildly elevated glucose that he was told about Final Clinical Impression(s) / ED Diagnoses Final diagnoses:  Gastroenteritis    Rx / DC Orders ED Discharge Orders         Ordered    ondansetron (ZOFRAN ODT) 4 MG disintegrating tablet     03/04/20 1443           Milton Ferguson, MD 03/04/20 [phone]

## 2020-03-04 NOTE — ED Notes (Signed)
X-ray at bedside

## 2020-03-04 NOTE — ED Triage Notes (Signed)
Pt reports abd pain, n/v/d since Saturday.

## 2020-03-04 NOTE — Discharge Instructions (Addendum)
Drink plenty of fluids.  Take Imodium for diarrhea.  Follow-up with your family doctor next week for recheck.  They will need to recheck your glucose again to because it has been slightly elevated.  Take Tylenol or Motrin for aches

## 2020-03-05 LAB — SARS CORONAVIRUS 2 (TAT 6-24 HRS): SARS Coronavirus 2: NEGATIVE

## 2020-08-05 ENCOUNTER — Other Ambulatory Visit: Payer: Self-pay

## 2020-08-05 ENCOUNTER — Emergency Department (HOSPITAL_COMMUNITY): Payer: BLUE CROSS/BLUE SHIELD

## 2020-08-05 ENCOUNTER — Emergency Department (HOSPITAL_COMMUNITY)
Admission: EM | Admit: 2020-08-05 | Discharge: 2020-08-05 | Disposition: A | Discharge status: Home / Self Care | Payer: BLUE CROSS/BLUE SHIELD | Attending: Emergency Medicine | Admitting: Emergency Medicine

## 2020-08-05 ENCOUNTER — Encounter (HOSPITAL_COMMUNITY): Payer: Self-pay

## 2020-08-05 DIAGNOSIS — I10 Essential (primary) hypertension: Secondary | ICD-10-CM | POA: Diagnosis not present

## 2020-08-05 DIAGNOSIS — F17210 Nicotine dependence, cigarettes, uncomplicated: Secondary | ICD-10-CM | POA: Diagnosis not present

## 2020-08-05 DIAGNOSIS — Z79899 Other long term (current) drug therapy: Secondary | ICD-10-CM | POA: Insufficient documentation

## 2020-08-05 DIAGNOSIS — M545 Low back pain, unspecified: Secondary | ICD-10-CM

## 2020-08-05 DIAGNOSIS — E119 Type 2 diabetes mellitus without complications: Secondary | ICD-10-CM | POA: Insufficient documentation

## 2020-08-05 MED ORDER — METHOCARBAMOL 500 MG PO TABS
500.0000 mg | ORAL_TABLET | Freq: Once | ORAL | Status: AC
Start: 2020-08-05 — End: 2020-08-05
  Administered 2020-08-05: 500 mg via ORAL
  Filled 2020-08-05: qty 1

## 2020-08-05 MED ORDER — METHOCARBAMOL 500 MG PO TABS: 500.0000 mg | ORAL_TABLET | Freq: Two times a day (BID) | ORAL | 0 refills | Status: AC

## 2020-08-05 MED ORDER — LIDOCAINE 5 % EX PTCH
1.0000 | MEDICATED_PATCH | CUTANEOUS | Status: DC
  Administered 2020-08-05: 1 via TRANSDERMAL
  Filled 2020-08-05: qty 1

## 2020-08-05 MED ORDER — KETOROLAC TROMETHAMINE 30 MG/ML IJ SOLN
30.0000 mg | Freq: Once | INTRAMUSCULAR | Status: AC
  Administered 2020-08-05: 30 mg via INTRAMUSCULAR
  Filled 2020-08-05: qty 1

## 2020-08-05 MED ORDER — NAPROXEN 500 MG PO TABS: 500.0000 mg | ORAL_TABLET | Freq: Two times a day (BID) | ORAL | 0 refills | Status: DC

## 2020-08-05 NOTE — ED Notes (Signed)
Pt drives a forklift   Low back pain for over a week   Has not  Seen PCP   Denies numbness to legs, incontinence  Ambulates slowly and carefully

## 2020-08-05 NOTE — ED Provider Notes (Signed)
[facility] EMERGENCY DEPARTMENT Provider Note   CSN: [id number] Arrival date & time: 08/05/20  0857     History Chief Complaint  Patient presents with  . Back Pain    Grant Anderson is a 44 y.o. male.  Grant Anderson is a 44 y.o. male with history of hypertension, hyperlipidemia and diabetes, who presents to the emergency department for evaluation of left low back pain.  Pain has been present for about a week and a half.  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  He has not seen anyone regarding this pain, has a history of some prior back issues and is prescribed hydrocodone but he states this pain has felt different and it has not been helping his symptoms as much.  Pain radiates some into the left hip but does not radiate down the leg, denies any midline pain.  No numbness tingling or weakness.  No loss of bowel or bladder control or saddle anesthesia.  No associated urinary symptoms or abdominal pain.  No fevers or chills.  No history of cancer or IV drug use.  Aside from his prescribed home hydrocodone he has not tried any other medications to treat his symptoms, does state that he tried applying some heat last night which gave him some brief improvement in pain.  No other aggravating or alleviating factors.        Past Medical History:  Diagnosis Date  . Diabetes mellitus without complication (HCC)   . Hyperlipidemia   . Hypertension     Patient Active Problem List   Diagnosis Date Noted  . DIVERTICULITIS, ACUTE 09/17/2010    Past Surgical History:  Procedure Laterality Date  . ADENOIDECTOMY    . TONSILLECTOMY         No family history on file.  Social History   Tobacco Use  . Smoking status: Current Every Day Smoker    Packs/day: 0.25    Types: Cigarettes  . Smokeless tobacco: Never Used  Substance Use Topics  . Alcohol use: Yes    Comment: occasional  .  Drug use: Yes    Types: Marijuana    Home Medications Prior to Admission medications   Medication Sig Start Date End Date Taking? Authorizing Provider  acetaminophen (TYLENOL) 500 MG tablet Take 1,000 mg by mouth every 6 (six) hours as needed for fever.    [provider]  albuterol (PROVENTIL HFA;VENTOLIN HFA) 108 (90 Base) MCG/ACT inhaler Inhale 1-2 puffs into the lungs every 6 (six) hours as needed for wheezing or shortness of breath. 01/04/18   Terrilee Files, MD  amLODipine (NORVASC) 10 MG tablet Take 10 mg by mouth daily. 12/03/19   [provider]  benazepril (LOTENSIN) 40 MG tablet Take 40 mg by mouth daily. 12/03/19   [provider]  benzonatate (TESSALON) 100 MG capsule Take 1 capsule (100 mg total) by mouth every 8 (eight) hours. 01/04/18   Terrilee Files, MD  dexamethasone (DECADRON) 6 MG tablet 1 po bid with food Patient not taking: Reported on 07/12/2015 04/15/15   Ivery Quale, PA-C  ibuprofen (ADVIL,MOTRIN) 800 MG tablet Take 1 tablet (800 mg total) by mouth 3 (three) times daily. 07/12/15   Ivery Quale, PA-C  loratadine-pseudoephedrine (CLARITIN-D 12 HOUR) 5-120 MG per tablet Take 1 tablet by mouth 2 (two) times daily. 07/12/15   Ivery Quale, PA-C  losartan-hydrochlorothiazide (HYZAAR) 100-12.5 MG per  tablet Take 1 tablet by mouth daily. 08/01/14   Mirian Mo, MD  meloxicam (MOBIC) 15 MG tablet Take 1 tablet (15 mg total) by mouth daily. Patient not taking: Reported on 07/12/2015 04/15/15   Ivery Quale, PA-C  ondansetron (ZOFRAN ODT) 4 MG disintegrating tablet 4mg  ODT q4 hours prn nausea/vomit 03/04/20   05/04/20, MD  oxyCODONE (OXY IR/ROXICODONE) 5 MG immediate release tablet Take 5 mg by mouth 4 (four) times daily. 02/18/20   [provider]  Phenyleph-CPM-DM-Aspirin (ALKA-SELTZER PLUS COLD & COUGH PO) Take 2 capsules by mouth every 4 (four) hours as needed (cold/cough).    [provider]  promethazine-codeine  (PHENERGAN WITH CODEINE) 6.25-10 MG/5ML syrup Take 5 mLs by mouth every 6 (six) hours as needed. 07/12/15   07/14/15, PA-C    Allergies    Patient has no known allergies.  Review of Systems   Review of Systems  Constitutional: Negative for chills and fever.  HENT: Negative.   Respiratory: Negative for shortness of breath.   Cardiovascular: Negative for chest pain.  Gastrointestinal: Negative for abdominal pain, constipation, diarrhea, nausea and vomiting.  Genitourinary: Negative for dysuria, flank pain, frequency and hematuria.  Musculoskeletal: Positive for back pain. Negative for arthralgias, gait problem, joint swelling, myalgias and neck pain.  Skin: Negative for color change, rash and wound.  Neurological: Negative for weakness and numbness.    Physical Exam Updated Vital Signs BP (!) 156/100 (BP Location: Right Arm)   Pulse 74   Temp 97.9 F (36.6 C) (Oral)   Resp 20   Ht 6\' 1"  (1.854 m)   Wt 133.4 kg   SpO2 100%   BMI 38.79 kg/m   Physical Exam Vitals and nursing note reviewed.  Constitutional:      General: He is not in acute distress.    Appearance: Normal appearance. He is well-developed. He is obese. He is not ill-appearing or diaphoretic.  HENT:     Head: Atraumatic.  Eyes:     General:        Right eye: No discharge.        Left eye: No discharge.  Cardiovascular:     Pulses:          Radial pulses are 2+ on the right side and 2+ on the left side.       Dorsalis pedis pulses are 2+ on the right side and 2+ on the left side.       Posterior tibial pulses are 2+ on the right side and 2+ on the left side.  Pulmonary:     Effort: Pulmonary effort is normal. No respiratory distress.  Abdominal:     General: Bowel sounds are normal. There is no distension.     Palpations: Abdomen is soft. There is no mass.     Tenderness: There is no abdominal tenderness. There is no guarding.     Comments: Abdomen soft, nondistended, nontender to palpation in all  quadrants without guarding or peritoneal signs, no CVA tenderness bilaterally  Musculoskeletal:     Cervical back: Neck supple.     Comments: Tenderness to palpation over the left low back musculature, with palpable spasm, no midline tenderness.  Pain made worse with range of motion of the lower extremities, but does not radiate down the legs, neg straight leg raise bilaterally  Skin:    General: Skin is warm and dry.     Capillary Refill: Capillary refill takes less than 2 seconds.  Neurological:  Mental Status: He is alert and oriented to person, place, and time.     Comments: Alert, clear speech, following commands. Moving all extremities without difficulty. Bilateral lower extremities with 5/5 strength in proximal and distal muscle groups and with dorsi and plantar flexion. Sensation intact in bilateral lower extremities. 2+ patellar DTRs bilaterally. Ambulatory with steady gait  Psychiatric:        Mood and Affect: Mood normal.        Behavior: Behavior normal.     ED Results / Procedures / Treatments   Labs (all labs ordered are listed, but only abnormal results are displayed) Labs Reviewed - No data to display  EKG None  Radiology DG Lumbar Spine Complete  Result Date: 08/05/2020 CLINICAL DATA:  Acute onset of low back pain beginning 1 week ago. EXAM: LUMBAR SPINE - COMPLETE 4+ VIEW COMPARISON:  Lumbar spine MRI 06/25/2017 FINDINGS: The alignment of the lumbar spine is normal. The vertebral body heights and disc spaces are well preserved. There is no significant fracture or dislocation identified. Mild multilevel disc space narrowing and ventral endplate spurring noted. IMPRESSION: Mild lumbar spondylosis.  No acute abnormality. Electronically Signed   By: Signa Kell M.D.   On: 08/05/2020 12:00    Procedures Procedures (including critical care time)  Medications Ordered in ED Medications  lidocaine (LIDODERM) 5 % 1 patch (1 patch Transdermal Patch Applied 08/05/20  1228)  ketorolac (TORADOL) 30 MG/ML injection 30 mg (30 mg Intramuscular Given 08/05/20 1227)  methocarbamol (ROBAXIN) tablet 500 mg (500 mg Oral Given 08/05/20 1226)    ED Course  I have reviewed the triage vital signs and the nursing notes.  Pertinent labs & imaging results that were available during my care of the patient were reviewed by me and considered in my medical decision making (see chart for details).    MDM Rules/Calculators/A&P                         Patient presents with complaint of back pain.  Patient is nontoxic appearing, vitals are WNL aside from mild HTN. Patient has normal neurologic exam, no point/focal midline tenderness to palpation. Ambulatory in the ED.  No back pain red flags. No urinary sxs. Most likely muscle strain versus spasm. Lumbar xray unremarkable. Considered UTI/pyelonephritis, kidney stone, aortic aneurysm/dissection, cauda equina or epidural abscess however these do not feel these diagnoses fit clinical picture at this time. Will treat with Naproxen and Robaxin, discussed with patient that they are not to drive or operate heavy machinery while taking Robaxin. I discussed treatment plan, need for PCP follow-up, and return precautions with the patient. Provided opportunity for questions, patient confirmed understanding and is in agreement with plan.   Final Clinical Impression(s) / ED Diagnoses Final diagnoses:  Acute left-sided low back pain without sciatica    Rx / DC Orders ED Discharge Orders         Ordered    naproxen (NAPROSYN) 500 MG tablet  2 times daily     Discontinue  Reprint     08/05/20 1240    methocarbamol (ROBAXIN) 500 MG tablet  2 times daily     Discontinue  Reprint     08/05/20 1240           Dartha Lodge, [place] 08/05/20 1246    Terrilee Files, MD 08/05/20 [phone]

## 2020-08-05 NOTE — Discharge Instructions (Addendum)
You were seen here today for Back Pain: Low back pain is discomfort in the lower back that may be due to injuries to muscles and ligaments around the spine. Occasionally, it may be caused by a problem to a part of the spine called a disc. Your back pain should be treated with medicines listed below as well as back exercises and this back pain should get better over the next 2 weeks. Most patients get completely well in 4 weeks. It is important to know however, if you develop severe or worsening pain, low back pain with fever, numbness, weakness or inability to walk or urinate, you should return to the ER immediately.  Please follow up with your doctor this week for a recheck if still having symptoms.  HOME INSTRUCTIONS Self - care:  The application of heat can help soothe the pain.  Maintaining your daily activities, including walking (this is encouraged), as it will help you get better faster than just staying in bed. Do not life, push, pull anything more than 10 pounds for the next week. I am attaching back exercises that you can do at home to help facilitate your recovery.   Back Exercises - I have attached a handout on back exercises that can be done at home to help facilitate your recovery.   Medications are also useful to help with pain control.   Acetaminophen.  This medication is generally safe, and found over the counter. Take as directed for your age. You should not take more than 8 of the extra strength (500mg ) pills a day (max dose is 4000mg  total OVER one day)  Non steroidal anti inflammatory: This includes medications including Ibuprofen, naproxen and Mobic; These medications help both pain and swelling and are very useful in treating back pain.  They should be taken with food, as they can cause stomach upset, and more seriously, stomach bleeding. Do not combine the medications.   Lidocaine Patch: Salon Pas lidocaine patches (blue and silver box) can be purchased over the counter and worn  for 12 hours for local pain relief   Muscle relaxants:  These medications can help with muscle tightness that is a cause of lower back pain.  Most of these medications can cause drowsiness, and it is not safe to drive or use dangerous machinery while taking them. They are primarily helpful when taken at night before sleep.  You will need to follow up with your primary healthcare provider  or the Orthopedist in 1-2 weeks for reassessment and persistent symptoms.  Be aware that if you develop new symptoms, such as a fever, leg weakness, difficulty with or loss of control of your urine or bowels, abdominal pain, or more severe pain, you will need to seek medical attention and/or return to the Emergency department. Additional Information:  Your vital signs today were: BP (!) 156/100 (BP Location: Right Arm)   Pulse 74   Temp 97.9 F (36.6 C) (Oral)   Resp 20   Ht 6\' 1"  (1.854 m)   Wt 133.4 kg   SpO2 100%   BMI 38.79 kg/m  If your blood pressure (BP) was elevated above 135/85 this visit, please have this repeated by your doctor within one month. ---------------

## 2020-08-05 NOTE — ED Notes (Signed)
From rad 

## 2020-08-05 NOTE — ED Triage Notes (Signed)
Pt c/o left lower back pain 1 1/2 weeks.  Denies injury.  Says pain radiates into left hip.  Denies any problems with bowels or bladder.

## 2020-09-09 ENCOUNTER — Other Ambulatory Visit: Payer: Self-pay | Admitting: Physician Assistant

## 2020-09-09 ENCOUNTER — Emergency Department (HOSPITAL_COMMUNITY)
Admission: EM | Admit: 2020-09-09 | Discharge: 2020-09-09 | Disposition: A | Discharge status: Home / Self Care | Payer: BLUE CROSS/BLUE SHIELD | Attending: Emergency Medicine | Admitting: Emergency Medicine

## 2020-09-09 ENCOUNTER — Encounter (HOSPITAL_COMMUNITY): Payer: Self-pay

## 2020-09-09 ENCOUNTER — Other Ambulatory Visit: Payer: Self-pay

## 2020-09-09 DIAGNOSIS — Z79899 Other long term (current) drug therapy: Secondary | ICD-10-CM | POA: Diagnosis not present

## 2020-09-09 DIAGNOSIS — F17210 Nicotine dependence, cigarettes, uncomplicated: Secondary | ICD-10-CM | POA: Diagnosis not present

## 2020-09-09 DIAGNOSIS — I10 Essential (primary) hypertension: Secondary | ICD-10-CM | POA: Diagnosis not present

## 2020-09-09 DIAGNOSIS — R509 Fever, unspecified: Secondary | ICD-10-CM | POA: Diagnosis present

## 2020-09-09 DIAGNOSIS — U071 COVID-19: Secondary | ICD-10-CM

## 2020-09-09 DIAGNOSIS — E119 Type 2 diabetes mellitus without complications: Secondary | ICD-10-CM | POA: Diagnosis not present

## 2020-09-09 LAB — SARS CORONAVIRUS 2 BY RT PCR (HOSPITAL ORDER, PERFORMED IN ~~LOC~~ HOSPITAL LAB): SARS Coronavirus 2: POSITIVE — AB

## 2020-09-09 NOTE — Progress Notes (Signed)
I connected by phone with Grant Anderson on 09/09/2020 at 4:23 PM to discuss the potential use of a new treatment for mild to moderate COVID-19 viral infection in non-hospitalized patients.  This patient is a 44 y.o. male that meets the FDA criteria for Emergency Use Authorization of COVID monoclonal antibody casirivimab/imdevimab.  Has a (+) direct SARS-CoV-2 viral test result  Has mild or moderate COVID-19   Is NOT hospitalized due to COVID-19  Is within 10 days of symptom onset  Has at least one of the high risk factor(s) for progression to severe COVID-19 and/or hospitalization as defined in EUA.  Specific high risk criteria : BMI > 25 and Cardiovascular disease or hypertension   I have spoken and communicated the following to the patient or parent/caregiver regarding COVID monoclonal antibody treatment:  1. FDA has authorized the emergency use for the treatment of mild to moderate COVID-19 in adults and pediatric patients with positive results of direct SARS-CoV-2 viral testing who are 13 years of age and older weighing at least 40 kg, and who are at high risk for progressing to severe COVID-19 and/or hospitalization.  2. The significant known and potential risks and benefits of COVID monoclonal antibody, and the extent to which such potential risks and benefits are unknown.  3. Information on available alternative treatments and the risks and benefits of those alternatives, including clinical trials.  4. Patients treated with COVID monoclonal antibody should continue to self-isolate and use infection control measures (e.g., wear mask, isolate, social distance, avoid sharing personal items, clean and disinfect "high touch" surfaces, and frequent handwashing) according to CDC guidelines.   5. The patient or parent/caregiver has the option to accept or refuse COVID monoclonal antibody treatment.  After reviewing this information with the patient, The patient agreed to proceed  with receiving casirivimab\imdevimab infusion and will be provided a copy of the Fact sheet prior to receiving the infusion.    [name] 09/09/2020 4:23 PM

## 2020-09-09 NOTE — ED Notes (Addendum)
Date and time results received: 09/09/20 0929  Test: Covid PCR Critical Value: POSITIVE  Name of Provider Notified: Langston Masker, PA   Orders Received? Or Actions Taken?: See chart

## 2020-09-09 NOTE — ED Triage Notes (Signed)
Pt reports headache, bodyaches, and fever since yesterday.  Reports was supposed to get first covid vaccine tomorrow.

## 2020-09-09 NOTE — ED Provider Notes (Signed)
[facility] EMERGENCY DEPARTMENT Provider Note   CSN: [id number] Arrival date & time: 09/09/20  0759     History Chief Complaint  Patient presents with  . Headache  . Fever    Grant Anderson is a 44 y.o. male.  The history is provided by the patient. No language interpreter was used.  Headache Pain location:  Generalized Quality:  Dull Radiates to:  Does not radiate Onset quality:  Sudden Progression:  Worsening Similar to prior headaches: no   Relieved by:  Nothing Worsened by:  Nothing Ineffective treatments:  None tried Associated symptoms: cough and fever   Risk factors: no anger   Fever Associated symptoms: cough and headaches   Pt exposed to covidPt reports     Past Medical History:  Diagnosis Date  . Diabetes mellitus without complication (HCC)   . Hyperlipidemia   . Hypertension     Patient Active Problem List   Diagnosis Date Noted  . DIVERTICULITIS, ACUTE 09/17/2010    Past Surgical History:  Procedure Laterality Date  . ADENOIDECTOMY    . TONSILLECTOMY         No family history on file.  Social History   Tobacco Use  . Smoking status: Current Every Day Smoker    Packs/day: 0.25    Types: Cigarettes  . Smokeless tobacco: Never Used  Substance Use Topics  . Alcohol use: Yes    Comment: occasional  . Drug use: Yes    Types: Marijuana    Home Medications Prior to Admission medications   Medication Sig Start Date End Date Taking? Authorizing Provider  acetaminophen (TYLENOL) 500 MG tablet Take 1,000 mg by mouth every 6 (six) hours as needed for fever.    [provider]  albuterol (PROVENTIL HFA;VENTOLIN HFA) 108 (90 Base) MCG/ACT inhaler Inhale 1-2 puffs into the lungs every 6 (six) hours as needed for wheezing or shortness of breath. 01/04/18   Terrilee Files, MD  amLODipine (NORVASC) 10 MG tablet Take 10 mg by mouth daily. 12/03/19   [provider]  benazepril (LOTENSIN) 40 MG tablet Take 40 mg by mouth  daily. 12/03/19   [provider]  benzonatate (TESSALON) 100 MG capsule Take 1 capsule (100 mg total) by mouth every 8 (eight) hours. 01/04/18   Terrilee Files, MD  dexamethasone (DECADRON) 6 MG tablet 1 po bid with food Patient not taking: Reported on 07/12/2015 04/15/15   Ivery Quale, PA-C  ibuprofen (ADVIL,MOTRIN) 800 MG tablet Take 1 tablet (800 mg total) by mouth 3 (three) times daily. 07/12/15   Ivery Quale, PA-C  loratadine-pseudoephedrine (CLARITIN-D 12 HOUR) 5-120 MG per tablet Take 1 tablet by mouth 2 (two) times daily. 07/12/15   Ivery Quale, PA-C  losartan-hydrochlorothiazide (HYZAAR) 100-12.5 MG per tablet Take 1 tablet by mouth daily. 08/01/14   Mirian Mo, MD  meloxicam (MOBIC) 15 MG tablet Take 1 tablet (15 mg total) by mouth daily. Patient not taking: Reported on 07/12/2015 04/15/15   Ivery Quale, PA-C  methocarbamol (ROBAXIN) 500 MG tablet Take 1 tablet (500 mg total) by mouth 2 (two) times daily. 08/05/20   Dartha Lodge, PA-C  naproxen (NAPROSYN) 500 MG tablet Take 1 tablet (500 mg total) by mouth 2 (two) times daily. 08/05/20   Dartha Lodge, PA-C  ondansetron (ZOFRAN ODT) 4 MG disintegrating tablet 4mg  ODT q4 hours prn nausea/vomit 03/04/20   05/04/20, MD  oxyCODONE (OXY IR/ROXICODONE) 5 MG immediate release tablet Take 5 mg by mouth 4 (four)  times daily. 02/18/20   [provider]  Phenyleph-CPM-DM-Aspirin (ALKA-SELTZER PLUS COLD & COUGH PO) Take 2 capsules by mouth every 4 (four) hours as needed (cold/cough).    [provider]  promethazine-codeine (PHENERGAN WITH CODEINE) 6.25-10 MG/5ML syrup Take 5 mLs by mouth every 6 (six) hours as needed. 07/12/15   Ivery Quale, PA-C    Allergies    Patient has no known allergies.  Review of Systems   Review of Systems  Constitutional: Positive for fever.  Respiratory: Positive for cough.   Neurological: Positive for headaches.  All other systems reviewed and are negative.   Physical  Exam Updated Vital Signs BP (!) 154/93 (BP Location: Right Arm)   Pulse 76   Temp 99.8 F (37.7 C) (Oral)   Resp 18   Ht 6\' 2"  (1.88 m)   Wt 133.8 kg   SpO2 98%   BMI 37.88 kg/m   Physical Exam Vitals and nursing note reviewed.  Constitutional:      Appearance: He is well-developed.  HENT:     Head: Normocephalic.     Mouth/Throat:     Mouth: Mucous membranes are moist.  Pulmonary:     Effort: Pulmonary effort is normal.  Abdominal:     General: There is no distension.  Musculoskeletal:        General: Normal range of motion.     Cervical back: Normal range of motion.  Skin:    General: Skin is warm.  Neurological:     Mental Status: He is alert and oriented to person, place, and time.     ED Results / Procedures / Treatments   Labs (all labs ordered are listed, but only abnormal results are displayed) Labs Reviewed  SARS CORONAVIRUS 2 BY RT PCR (HOSPITAL ORDER, PERFORMED IN [facility]) - Abnormal; Notable for the following components:      Result Value   SARS Coronavirus 2 POSITIVE (*)    All other components within normal limits    EKG None  Radiology No results found.  Procedures Procedures (including critical care time)  Medications Ordered in ED Medications - No data to display  ED Course  I have reviewed the triage vital signs and the nursing notes.  Pertinent labs & imaging results that were available during my care of the patient were reviewed by me and considered in my medical decision making (see chart for details).    MDM Rules/Calculators/A&P                          MDM:  Pt has hypertension and increased BMI.  Pt referred to [facility]  Final Clinical Impression(s) / ED Diagnoses Final diagnoses:  COVID-19    Rx / DC Orders ED Discharge Orders    None    An After Visit Summary was printed and given to the patient.    , Elson Areas 09/09/20 1007    09/11/20, MD 09/10/20 1246

## 2020-09-10 ENCOUNTER — Ambulatory Visit (HOSPITAL_COMMUNITY)
Admission: RE | Admit: 2020-09-10 | Discharge: 2020-09-10 | Disposition: A | Discharge status: Home / Self Care | Payer: BLUE CROSS/BLUE SHIELD | Source: Ambulatory Visit | Attending: Pulmonary Disease | Admitting: Pulmonary Disease

## 2020-09-10 DIAGNOSIS — U071 COVID-19: Secondary | ICD-10-CM | POA: Diagnosis present

## 2020-09-10 MED ORDER — ALBUTEROL SULFATE HFA 108 (90 BASE) MCG/ACT IN AERS: 2.0000 | INHALATION_SPRAY | Freq: Once | RESPIRATORY_TRACT | Status: DC | PRN

## 2020-09-10 MED ORDER — FAMOTIDINE IN NACL 20-0.9 MG/50ML-% IV SOLN: 20.0000 mg | Freq: Once | INTRAVENOUS | Status: DC | PRN

## 2020-09-10 MED ORDER — SODIUM CHLORIDE 0.9 % IV SOLN: INTRAVENOUS | Status: DC | PRN

## 2020-09-10 MED ORDER — METHYLPREDNISOLONE SODIUM SUCC 125 MG IJ SOLR: 125.0000 mg | Freq: Once | INTRAMUSCULAR | Status: DC | PRN

## 2020-09-10 MED ORDER — SODIUM CHLORIDE 0.9 % IV SOLN
1200.0000 mg | Freq: Once | INTRAVENOUS | Status: AC
  Administered 2020-09-10: 1200 mg via INTRAVENOUS

## 2020-09-10 MED ORDER — DIPHENHYDRAMINE HCL 50 MG/ML IJ SOLN: 50.0000 mg | Freq: Once | INTRAMUSCULAR | Status: DC | PRN

## 2020-09-10 MED ORDER — EPINEPHRINE 0.3 MG/0.3ML IJ SOAJ: 0.3000 mg | Freq: Once | INTRAMUSCULAR | Status: DC | PRN

## 2020-09-10 NOTE — Discharge Instructions (Signed)

## 2020-09-10 NOTE — Progress Notes (Signed)
  Diagnosis: COVID-19  Physician:Dr Wright  Procedure: Covid Infusion Clinic Med: casirivimab\imdevimab infusion - Provided patient with casirivimab\imdevimab fact sheet for patients, parents and caregivers prior to infusion.  Complications: No immediate complications noted.  Discharge: Discharged home   Grant Anderson 09/10/2020  

## 2021-02-03 ENCOUNTER — Emergency Department (HOSPITAL_COMMUNITY)
Admission: EM | Admit: 2021-02-03 | Discharge: 2021-02-03 | Disposition: A | Discharge status: Home / Self Care | Payer: BC Managed Care – PPO | Attending: Emergency Medicine | Admitting: Emergency Medicine

## 2021-02-03 ENCOUNTER — Encounter (HOSPITAL_COMMUNITY): Payer: Self-pay | Admitting: *Deleted

## 2021-02-03 ENCOUNTER — Other Ambulatory Visit: Payer: Self-pay

## 2021-02-03 ENCOUNTER — Emergency Department (HOSPITAL_COMMUNITY): Payer: BC Managed Care – PPO

## 2021-02-03 DIAGNOSIS — I10 Essential (primary) hypertension: Secondary | ICD-10-CM | POA: Diagnosis not present

## 2021-02-03 DIAGNOSIS — M545 Low back pain, unspecified: Secondary | ICD-10-CM | POA: Diagnosis not present

## 2021-02-03 DIAGNOSIS — W010XXA Fall on same level from slipping, tripping and stumbling without subsequent striking against object, initial encounter: Secondary | ICD-10-CM | POA: Diagnosis not present

## 2021-02-03 DIAGNOSIS — F17210 Nicotine dependence, cigarettes, uncomplicated: Secondary | ICD-10-CM | POA: Insufficient documentation

## 2021-02-03 DIAGNOSIS — E119 Type 2 diabetes mellitus without complications: Secondary | ICD-10-CM | POA: Insufficient documentation

## 2021-02-03 DIAGNOSIS — Z79899 Other long term (current) drug therapy: Secondary | ICD-10-CM | POA: Insufficient documentation

## 2021-02-03 DIAGNOSIS — Y9301 Activity, walking, marching and hiking: Secondary | ICD-10-CM | POA: Diagnosis not present

## 2021-02-03 MED ORDER — LIDOCAINE 5 % EX PTCH
1.0000 | MEDICATED_PATCH | CUTANEOUS | Status: DC
  Administered 2021-02-03: 1 via TRANSDERMAL
  Filled 2021-02-03: qty 1

## 2021-02-03 MED ORDER — ACETAMINOPHEN 325 MG PO TABS
650.0000 mg | ORAL_TABLET | Freq: Once | ORAL | Status: AC
  Administered 2021-02-03: 650 mg via ORAL
  Filled 2021-02-03: qty 2

## 2021-02-03 MED ORDER — NAPROXEN 500 MG PO TABS: 500.0000 mg | ORAL_TABLET | Freq: Two times a day (BID) | ORAL | 0 refills | Status: DC

## 2021-02-03 MED ORDER — METHOCARBAMOL 500 MG PO TABS: 500.0000 mg | ORAL_TABLET | Freq: Two times a day (BID) | ORAL | 0 refills | Status: AC

## 2021-02-03 NOTE — Discharge Instructions (Addendum)
As discussed, your CT scan was negative for any broken bones.  I suspect you have a muscular strain.  I am sending you home with a pain medication and muscle relaxer.  Muscle relaxer can cause drowsiness, so do not drive or operate machinery while on the medication.  I am Also including low back exercises.  Perform daily.  Please follow-up with PCP if symptoms do not improve within the next week.  Return to the ER for new or worsening symptoms.

## 2021-02-03 NOTE — ED Provider Notes (Signed)
[facility] EMERGENCY DEPARTMENT Provider Note   CSN: [id number] Arrival date & time: 02/03/21  4650     History Chief Complaint  Patient presents with  . Back Pain    Grant Anderson is a 45 y.o. male with a past medical history significant for diabetes, hyperlipidemia, and hypertension who presents to the ED due to low back pain x3 days.  Patient tripped as he was walking downstairs landing directly on his low back and slid down 3 stairs. No head injury or loss of consciousness.  Patient states pain has been constant since the accident and nonradiating in nature.  Pain located midline lumbar region. Rates his pain a 4/10 worse with movement especially ambulation.  Denies saddle paresthesias, bowel/bladder incontinence, lower extremity numbness/tingling, lower extremity weakness, fever/chills.  Denies associated abdominal pain and urinary symptoms.  No treatment prior to arrival.  History obtained from patient and past medical records. No interpreter used during encounter.      Past Medical History:  Diagnosis Date  . Diabetes mellitus without complication (HCC)   . Hyperlipidemia   . Hypertension     Patient Active Problem List   Diagnosis Date Noted  . DIVERTICULITIS, ACUTE 09/17/2010    Past Surgical History:  Procedure Laterality Date  . ADENOIDECTOMY    . TONSILLECTOMY         No family history on file.  Social History   Tobacco Use  . Smoking status: Current Every Day Smoker    Packs/day: 0.25    Types: Cigarettes  . Smokeless tobacco: Never Used  Substance Use Topics  . Alcohol use: Yes    Comment: occasional  . Drug use: Yes    Types: Marijuana    Home Medications Prior to Admission medications   Medication Sig Start Date End Date Taking? Authorizing Provider  methocarbamol (ROBAXIN) 500 MG tablet Take 1 tablet (500 mg total) by mouth 2 (two) times daily. 02/03/21  Yes [place] C, PA-C  naproxen (NAPROSYN) 500 MG tablet Take 1 tablet  (500 mg total) by mouth 2 (two) times daily. 02/03/21  Yes Rayden Scheper, Merla Riches, PA-C  acetaminophen (TYLENOL) 500 MG tablet Take 1,000 mg by mouth every 6 (six) hours as needed for fever.    [provider]  albuterol (PROVENTIL HFA;VENTOLIN HFA) 108 (90 Base) MCG/ACT inhaler Inhale 1-2 puffs into the lungs every 6 (six) hours as needed for wheezing or shortness of breath. 01/04/18   Terrilee Files, MD  amLODipine (NORVASC) 10 MG tablet Take 10 mg by mouth daily. 12/03/19   [provider]  benazepril (LOTENSIN) 40 MG tablet Take 40 mg by mouth daily. 12/03/19   [provider]  benzonatate (TESSALON) 100 MG capsule Take 1 capsule (100 mg total) by mouth every 8 (eight) hours. 01/04/18   Terrilee Files, MD  dexamethasone (DECADRON) 6 MG tablet 1 po bid with food Patient not taking: Reported on 07/12/2015 04/15/15   Ivery Quale, PA-C  ibuprofen (ADVIL,MOTRIN) 800 MG tablet Take 1 tablet (800 mg total) by mouth 3 (three) times daily. 07/12/15   Ivery Quale, PA-C  loratadine-pseudoephedrine (CLARITIN-D 12 HOUR) 5-120 MG per tablet Take 1 tablet by mouth 2 (two) times daily. 07/12/15   Ivery Quale, PA-C  losartan-hydrochlorothiazide (HYZAAR) 100-12.5 MG per tablet Take 1 tablet by mouth daily. 08/01/14   Mirian Mo, MD  meloxicam (MOBIC) 15 MG tablet Take 1 tablet (15 mg total) by mouth daily. Patient not taking: Reported on 07/12/2015 04/15/15   Beverely Pace,  [place], PA-C  methocarbamol (ROBAXIN) 500 MG tablet Take 1 tablet (500 mg total) by mouth 2 (two) times daily. 08/05/20   Dartha Lodge, PA-C  naproxen (NAPROSYN) 500 MG tablet Take 1 tablet (500 mg total) by mouth 2 (two) times daily. 08/05/20   Dartha Lodge, PA-C  ondansetron (ZOFRAN ODT) 4 MG disintegrating tablet 4mg  ODT q4 hours prn nausea/vomit 03/04/20   05/04/20, MD  oxyCODONE (OXY IR/ROXICODONE) 5 MG immediate release tablet Take 5 mg by mouth 4 (four) times daily. 02/18/20   [provider]   Phenyleph-CPM-DM-Aspirin (ALKA-SELTZER PLUS COLD & COUGH PO) Take 2 capsules by mouth every 4 (four) hours as needed (cold/cough).    [provider]  promethazine-codeine (PHENERGAN WITH CODEINE) 6.25-10 MG/5ML syrup Take 5 mLs by mouth every 6 (six) hours as needed. 07/12/15   07/14/15, PA-C    Allergies    Patient has no known allergies.  Review of Systems   Review of Systems  Constitutional: Negative for chills and fever.  Genitourinary: Negative for difficulty urinating.  Musculoskeletal: Positive for back pain.  Neurological: Negative for numbness.  All other systems reviewed and are negative.   Physical Exam Updated Vital Signs BP (!) 136/95   Pulse 74   Temp 98 F (36.7 C) (Oral)   Resp 20   Ht 6\' 2"  (1.88 m)   Wt 129.3 kg   SpO2 100%   BMI 36.59 kg/m   Physical Exam Vitals and nursing note reviewed.  Constitutional:      General: He is not in acute distress.    Appearance: He is not ill-appearing.  HENT:     Head: Normocephalic.  Eyes:     Pupils: Pupils are equal, round, and reactive to light.  Neck:     Comments: No cervical midline tenderness Cardiovascular:     Rate and Rhythm: Normal rate and regular rhythm.     Pulses: Normal pulses.     Heart sounds: Normal heart sounds. No murmur heard. No friction rub. No gallop.   Pulmonary:     Effort: Pulmonary effort is normal.     Breath sounds: Normal breath sounds.  Abdominal:     General: Abdomen is flat. Bowel sounds are normal. There is no distension.     Palpations: Abdomen is soft.     Tenderness: There is no abdominal tenderness. There is no guarding or rebound.     Comments: Abdomen soft, nondistended, nontender to palpation in all quadrants without guarding or peritoneal signs. No rebound.   Musculoskeletal:     Cervical back: Neck supple.     Comments: No T-spine midline tenderness Lumbar midline tenderness No leg edema bilaterally Patient moves all extremities without  difficulty. DP/PT pulses 2+ and equal bilaterally Sensation grossly intact bilaterally Strength of knee flexion and extension is 5/5 Plantar and dorsiflexion of ankle 5/5 Able to ambulate without difficulty  Skin:    General: Skin is warm and dry.  Neurological:     General: No focal deficit present.     Mental Status: He is alert.  Psychiatric:        Mood and Affect: Mood normal.        Behavior: Behavior normal.     ED Results / Procedures / Treatments   Labs (all labs ordered are listed, but only abnormal results are displayed) Labs Reviewed - No data to display  EKG None  Radiology CT Lumbar Spine Wo Contrast  Result Date: 02/03/2021 CLINICAL DATA:  Low back pain, recent fall EXAM: CT LUMBAR SPINE WITHOUT CONTRAST TECHNIQUE: Multidetector CT imaging of the lumbar spine was performed without intravenous contrast administration. Multiplanar CT image reconstructions were also generated. COMPARISON:  Lumbar spine radiographs August 2021, MRI 2018 FINDINGS: Segmentation: 5 lumbar type vertebrae. Alignment: Preserved. Vertebrae: Mid stable vertebral body heights.  No acute fracture. Paraspinal and other soft tissues: Unremarkable. Disc levels: Congenital narrowing of the spinal canal. Minimal disc bulges are present. There is a small central protrusion with mild calcification at L5-S1. No significant canal or foraminal narrowing at any level. Likely similar to prior MRI. IMPRESSION: No acute fracture. Electronically Signed   By: Guadlupe Spanish M.D.   On: 02/03/2021 12:16    Procedures Procedures   Medications Ordered in ED Medications  lidocaine (LIDODERM) 5 % 1 patch (1 patch Transdermal Patch Applied 02/03/21 1111)  acetaminophen (TYLENOL) tablet 650 mg (650 mg Oral Given 02/03/21 1112)    ED Course  I have reviewed the triage vital signs and the nursing notes.  Pertinent labs & imaging results that were available during my care of the patient were reviewed by me and considered  in my medical decision making (see chart for details).    MDM Rules/Calculators/A&P                         45 year old male presents to the ED due to traumatic low back pain x3 days after sliding down 3 stairs on his back.  Patient denies saddle paresthesias, bowel/bladder incontinence, lower extremity weakness, lower extremity numbness/tingling, and fever/chills.  Upon arrival, stable vitals.  Physical exam significant for lumbar midline tenderness.  Patient able to ambulate here in the ED. No neurological deficits. Given trauma and midline tenderness, will obtain CT lumbar spine to rule out bony fractures.  Tylenol and Lidoderm patch given for symptomatic treatment.  CT lumbar personally viewed which is negative for any bony fractures.  Suspect muscular strain. Patient discharged with naproxen and Robaxin.  Low back exercises given to patient at discharge.  Advised patient to follow-up with PCP if symptoms not improved within the next week. Strict ED precautions discussed with patient. Patient states understanding and agrees to plan. Patient discharged home in no acute distress and stable vitals.  Final Clinical Impression(s) / ED Diagnoses Final diagnoses:  Acute midline low back pain without sciatica    Rx / DC Orders ED Discharge Orders         Ordered    naproxen (NAPROSYN) 500 MG tablet  2 times daily        02/03/21 1241    methocarbamol (ROBAXIN) 500 MG tablet  2 times daily        02/03/21 1241           Jesusita Oka 02/03/21 1243    Bethann Berkshire, MD 02/08/21 [phone]

## 2021-02-03 NOTE — ED Triage Notes (Signed)
Back pain onset 3 days ago, states he slipped and fell early am 3 days ago. States pain is worse with movement.

## 2021-10-06 ENCOUNTER — Other Ambulatory Visit: Payer: Self-pay | Admitting: Occupational Medicine

## 2021-10-06 ENCOUNTER — Other Ambulatory Visit: Payer: Self-pay

## 2021-10-06 ENCOUNTER — Ambulatory Visit: Payer: Self-pay

## 2021-10-06 DIAGNOSIS — Z0000 Encounter for general adult medical examination without abnormal findings: Secondary | ICD-10-CM

## 2022-06-17 ENCOUNTER — Other Ambulatory Visit: Payer: Self-pay | Admitting: Internal Medicine

## 2022-06-17 ENCOUNTER — Other Ambulatory Visit (HOSPITAL_COMMUNITY): Payer: Self-pay | Admitting: Internal Medicine

## 2022-06-17 DIAGNOSIS — R55 Syncope and collapse: Secondary | ICD-10-CM

## 2022-06-28 ENCOUNTER — Ambulatory Visit (HOSPITAL_COMMUNITY)
Admission: RE | Admit: 2022-06-28 | Discharge: 2022-06-28 | Disposition: A | Discharge status: Home / Self Care | Payer: Self-pay | Source: Ambulatory Visit | Attending: Internal Medicine | Admitting: Internal Medicine

## 2022-06-28 DIAGNOSIS — R55 Syncope and collapse: Secondary | ICD-10-CM | POA: Insufficient documentation

## 2022-07-13 IMAGING — CT CT L SPINE W/O CM
3 series · 13 of 33 positions shown, 16 images · non-contrast
Comparison: Lumbar spine radiographs July 2020, MRI 9905

CLINICAL DATA: Low back pain, recent fall

EXAM:
CT LUMBAR SPINE WITHOUT CONTRAST
TECHNIQUE: Multidetector CT imaging of the lumbar spine was performed without
intravenous contrast administration. Multiplanar CT image
reconstructions were also generated.

[Series 4: l spine soft · axial · 0.32mm/px · z∈[+1162,+1342]mm · 5 of 132 slices shown, 7 images]
[im 21/132  soft-tissue]
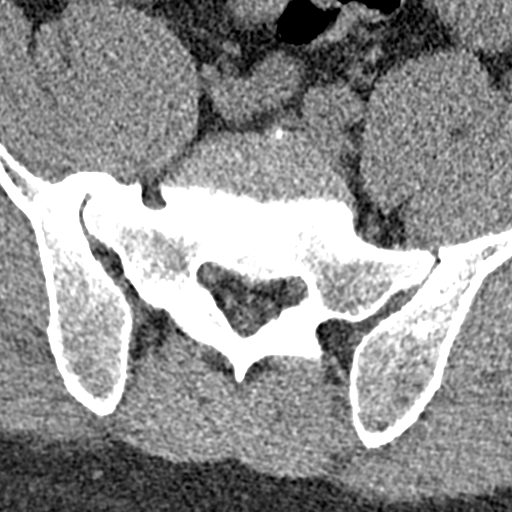
[im 21/132  bone]
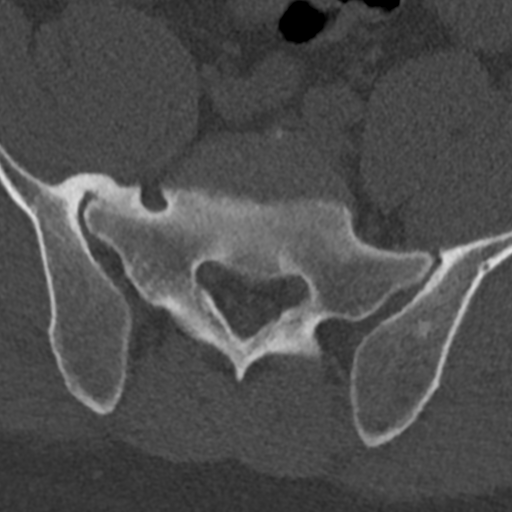
[im 41/132  bone]
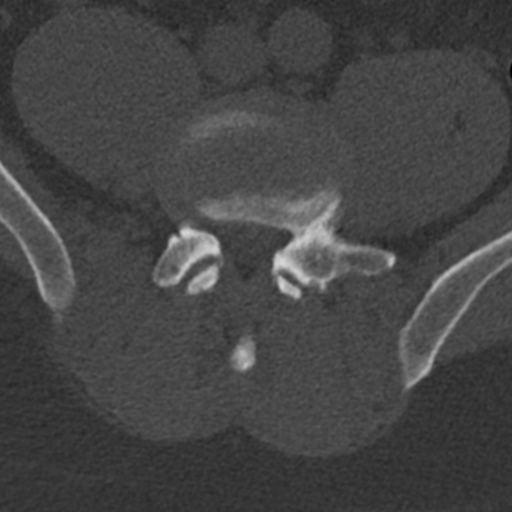
[im 71/132  bone]
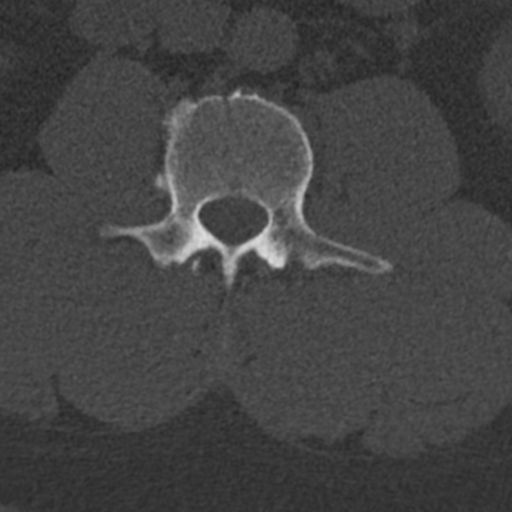
[im 91/132  bone]
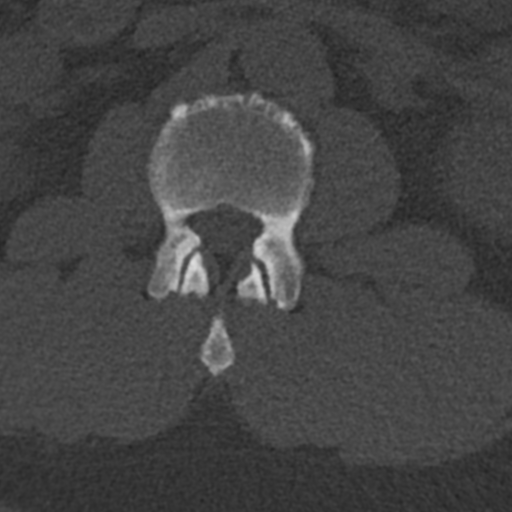
[im 111/132  soft-tissue]
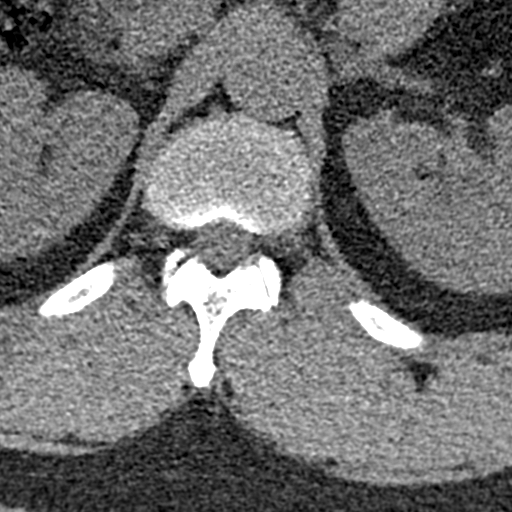
[im 111/132  bone]
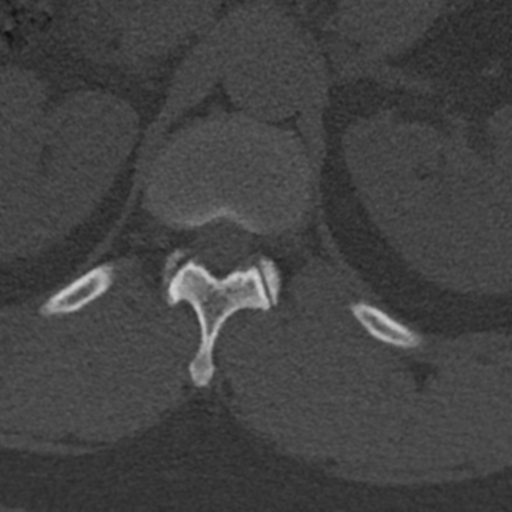

[Series 5: sagittal bone · sagittal · 0.34mm/px · 5 of 83 slices shown, 6 images]
[im 28/83  bone]
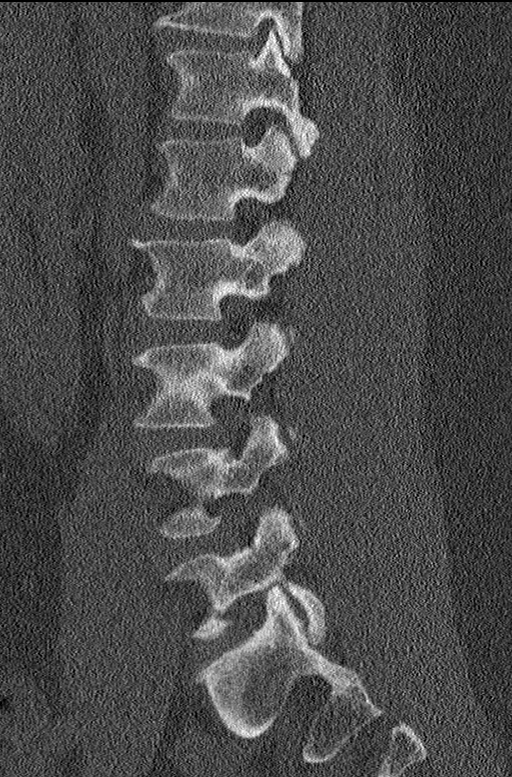
[im 35/83  bone]
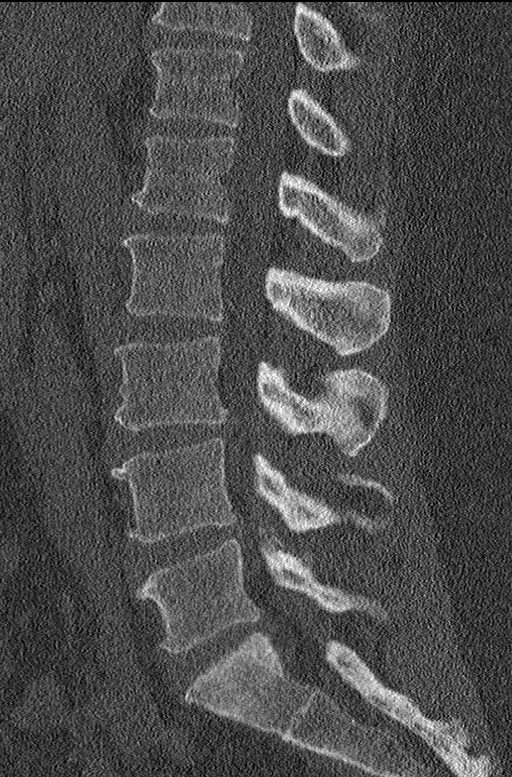
[im 42/83  soft-tissue]
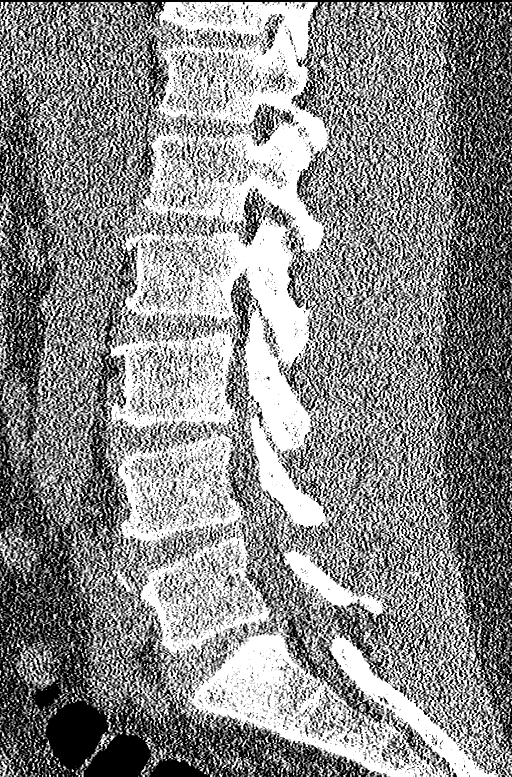
[im 42/83  bone]
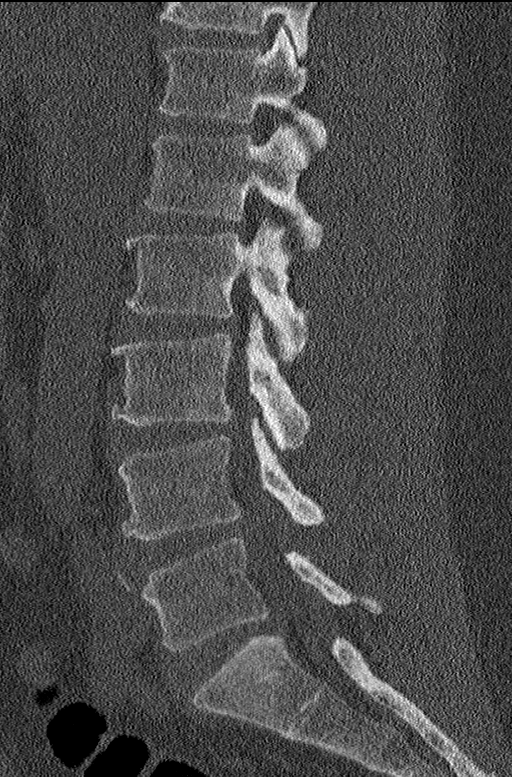
[im 48/83  bone]
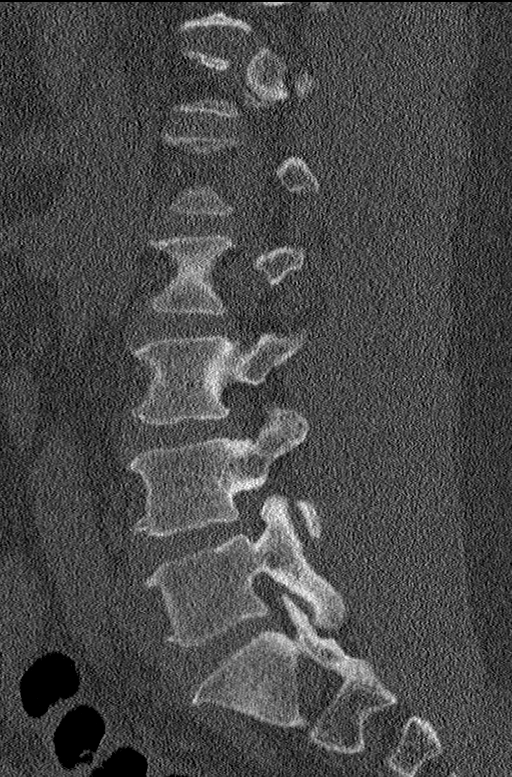
[im 55/83  bone]
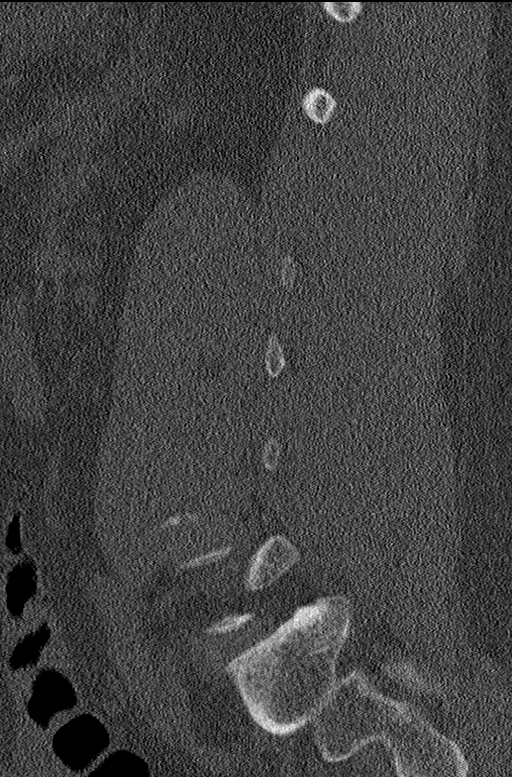

[Series 6: coronal bone · coronal · 0.38mm/px · 3 of 67 slices shown]
[im 14/67  bone]
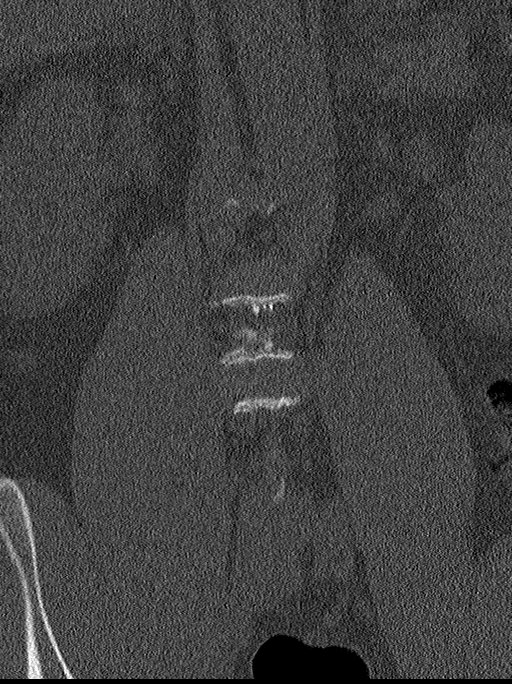
[im 27/67  bone]
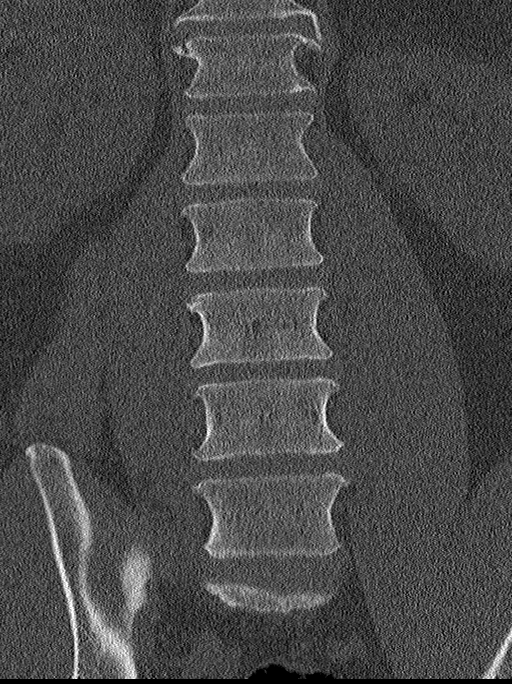
[im 40/67  bone]
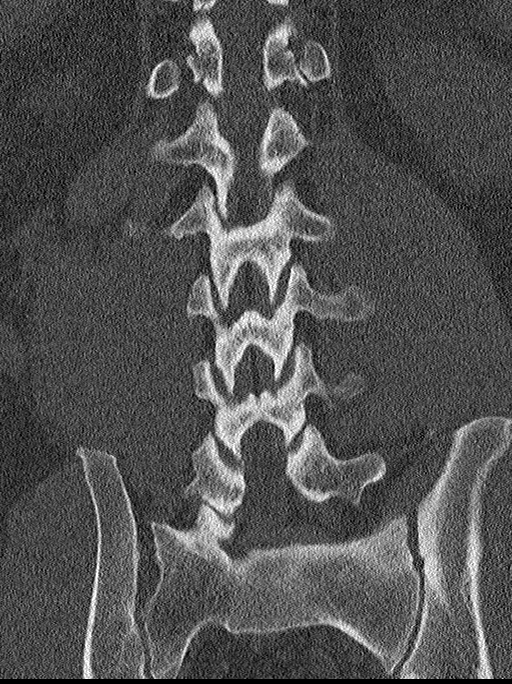

[13 of 33 positions shown; findings below may reference images not displayed]

FINDINGS: Segmentation: 5 lumbar type vertebrae.

Alignment: Preserved.

Vertebrae: Mid stable vertebral body heights.  No acute fracture.

Paraspinal and other soft tissues: Unremarkable.

Disc levels: Congenital narrowing of the spinal canal. Minimal disc
bulges are present. There is a small central protrusion with mild
calcification at L5-S1. No significant canal or foraminal narrowing
at any level. Likely similar to prior MRI.
IMPRESSION: No acute fracture.

## 2023-01-06 ENCOUNTER — Emergency Department (HOSPITAL_COMMUNITY)
Admission: EM | Admit: 2023-01-06 | Discharge: 2023-01-06 | Disposition: A | Discharge status: Home / Self Care | Payer: Managed Care, Other (non HMO) | Attending: Student | Admitting: Student

## 2023-01-06 ENCOUNTER — Encounter (HOSPITAL_COMMUNITY): Payer: Self-pay | Admitting: *Deleted

## 2023-01-06 ENCOUNTER — Other Ambulatory Visit: Payer: Self-pay

## 2023-01-06 DIAGNOSIS — Z7982 Long term (current) use of aspirin: Secondary | ICD-10-CM | POA: Insufficient documentation

## 2023-01-06 DIAGNOSIS — J111 Influenza due to unidentified influenza virus with other respiratory manifestations: Secondary | ICD-10-CM | POA: Diagnosis not present

## 2023-01-06 DIAGNOSIS — E119 Type 2 diabetes mellitus without complications: Secondary | ICD-10-CM | POA: Insufficient documentation

## 2023-01-06 DIAGNOSIS — Z1152 Encounter for screening for COVID-19: Secondary | ICD-10-CM | POA: Insufficient documentation

## 2023-01-06 DIAGNOSIS — R509 Fever, unspecified: Secondary | ICD-10-CM | POA: Diagnosis present

## 2023-01-06 DIAGNOSIS — R051 Acute cough: Secondary | ICD-10-CM

## 2023-01-06 LAB — RESP PANEL BY RT-PCR (RSV, FLU A&B, COVID)  RVPGX2
Influenza A by PCR: POSITIVE — AB
Influenza B by PCR: NEGATIVE
Resp Syncytial Virus by PCR: NEGATIVE
SARS Coronavirus 2 by RT PCR: NEGATIVE

## 2023-01-06 MED ORDER — HYDROCOD POLI-CHLORPHE POLI ER 10-8 MG/5ML PO SUER: 5.0000 mL | Freq: Two times a day (BID) | ORAL | 0 refills | Status: AC | PRN

## 2023-01-06 MED ORDER — ACETAMINOPHEN 325 MG PO TABS
650.0000 mg | ORAL_TABLET | Freq: Once | ORAL | Status: AC
  Administered 2023-01-06: 650 mg via ORAL
  Filled 2023-01-06: qty 2

## 2023-01-06 MED ORDER — IBUPROFEN 800 MG PO TABS
800.0000 mg | ORAL_TABLET | Freq: Once | ORAL | Status: AC
  Administered 2023-01-06: 800 mg via ORAL
  Filled 2023-01-06: qty 1

## 2023-01-06 MED ORDER — ONDANSETRON HCL 4 MG PO TABS: 4.0000 mg | ORAL_TABLET | Freq: Four times a day (QID) | ORAL | 0 refills | Status: AC

## 2023-01-06 NOTE — Discharge Instructions (Signed)
You have the flu this is a viral infection that will likely start to improve after 7-10 days, antibiotics are not helpful in treating viral infections. You may use Zofran as needed for nausea. Please make sure you are drinking plenty of fluids. You can treat your symptoms supportively with tylenol 650 mg/1000mg and ibuprofen 600 mg every 6 hours for fevers and pains. For nasal congestion you can use Zyrtec and Flonase to help with nasal congestion. To treat cough you can use over the counter cough medications such as Mucinex DM or Robitussin and throat lozenges. If your symptoms are not improving please follow up with you Primary doctor.   If you develop persistent fevers, shortness of breath or difficulty breathing, chest pain, severe headache and neck pain, persistent nausea and vomiting or other new or concerning symptoms return to the Emergency department.  

## 2023-01-06 NOTE — ED Triage Notes (Signed)
Pt with cough, chest hurts with coughing and deep breath.  Cough started Tuesday night, + fevers- 102.4 this morning.  Tylenol last was last night. Pt has taken cough med this morning. C/o pain to bilateral posterior legs.

## 2023-01-06 NOTE — ED Provider Notes (Signed)
[facility] EMERGENCY DEPARTMENT Provider Note   CSN: [id number] Arrival date & time: 01/06/23  1229     History  Chief Complaint  Patient presents with   Cough    Grant Anderson is a 47 y.o. male with past medical history significant for high blood pressure, diabetes who presents with concern for 2 days of cough, chest pain, chest tightness, fever, body aches, 1 episode of emesis.  Patient reports Tmax 102.4 this morning, last Tylenol was last night, reports that it did not significantly improve body aches or fever.  Patient denies any known sick contacts.  Denies any diarrhea, abdominal pain.  He reports his primary symptom that is causing him distress at this time is his chest pain with coughing.  He denies any chest pain at rest.  He denies any exertional component to his chest pain, shortness of breath.   Cough      Home Medications Prior to Admission medications   Medication Sig Start Date End Date Taking? Authorizing Provider  chlorpheniramine-HYDROcodone (TUSSIONEX) 10-8 MG/5ML Take 5 mLs by mouth every 12 (twelve) hours as needed for cough. 01/06/23  Yes Terena Bohan H, PA-C  ondansetron (ZOFRAN) 4 MG tablet Take 1 tablet (4 mg total) by mouth every 6 (six) hours. 01/06/23  Yes Skipper Dacosta H, PA-C  acetaminophen (TYLENOL) 500 MG tablet Take 1,000 mg by mouth every 6 (six) hours as needed for fever.    [provider]  albuterol (PROVENTIL HFA;VENTOLIN HFA) 108 (90 Base) MCG/ACT inhaler Inhale 1-2 puffs into the lungs every 6 (six) hours as needed for wheezing or shortness of breath. 01/04/18   Hayden Rasmussen, MD  amLODipine (NORVASC) 10 MG tablet Take 10 mg by mouth daily. 12/03/19   [provider]  benazepril (LOTENSIN) 40 MG tablet Take 40 mg by mouth daily. 12/03/19   [provider]  benzonatate (TESSALON) 100 MG capsule Take 1 capsule (100 mg total) by mouth every 8 (eight) hours. 01/04/18   Hayden Rasmussen, MD   dexamethasone (DECADRON) 6 MG tablet 1 po bid with food Patient not taking: Reported on 07/12/2015 04/15/15   Lily Kocher, PA-C  ibuprofen (ADVIL,MOTRIN) 800 MG tablet Take 1 tablet (800 mg total) by mouth 3 (three) times daily. 07/12/15   Lily Kocher, PA-C  loratadine-pseudoephedrine (CLARITIN-D 12 HOUR) 5-120 MG per tablet Take 1 tablet by mouth 2 (two) times daily. 07/12/15   Lily Kocher, PA-C  losartan-hydrochlorothiazide (HYZAAR) 100-12.5 MG per tablet Take 1 tablet by mouth daily. 08/01/14   Debby Freiberg, MD  meloxicam (MOBIC) 15 MG tablet Take 1 tablet (15 mg total) by mouth daily. Patient not taking: Reported on 07/12/2015 04/15/15   Lily Kocher, PA-C  methocarbamol (ROBAXIN) 500 MG tablet Take 1 tablet (500 mg total) by mouth 2 (two) times daily. 08/05/20   Jacqlyn Larsen, PA-C  methocarbamol (ROBAXIN) 500 MG tablet Take 1 tablet (500 mg total) by mouth 2 (two) times daily. 02/03/21   Suzy Bouchard, PA-C  naproxen (NAPROSYN) 500 MG tablet Take 1 tablet (500 mg total) by mouth 2 (two) times daily. 08/05/20   Jacqlyn Larsen, PA-C  naproxen (NAPROSYN) 500 MG tablet Take 1 tablet (500 mg total) by mouth 2 (two) times daily. 02/03/21   Suzy Bouchard, PA-C  ondansetron (ZOFRAN ODT) 4 MG disintegrating tablet 4mg  ODT q4 hours prn nausea/vomit 03/04/20   Milton Ferguson, MD  oxyCODONE (OXY IR/ROXICODONE) 5 MG immediate release tablet Take 5 mg by mouth 4 (four)  times daily. 02/18/20   [provider]  Phenyleph-CPM-DM-Aspirin (ALKA-SELTZER PLUS COLD & COUGH PO) Take 2 capsules by mouth every 4 (four) hours as needed (cold/cough).    [provider]  promethazine-codeine (PHENERGAN WITH CODEINE) 6.25-10 MG/5ML syrup Take 5 mLs by mouth every 6 (six) hours as needed. 07/12/15   Ivery Quale, PA-C      Allergies    Patient has no known allergies.    Review of Systems   Review of Systems  Respiratory:  Positive for cough and chest tightness.   All other systems  reviewed and are negative.   Physical Exam Updated Vital Signs BP (!) 151/83 (BP Location: Right Arm)   Pulse 100   Temp (!) 101.5 F (38.6 C) (Oral)   Resp 16   Ht 6\' 2"  (1.88 m)   Wt 125.6 kg   SpO2 95%   BMI 35.56 kg/m  Physical Exam Vitals and nursing note reviewed.  Constitutional:      General: He is not in acute distress.    Appearance: Normal appearance.  HENT:     Head: Normocephalic and atraumatic.     Nose:     Comments: No significant posterior oropharynx erythema, swelling, exudate. Uvula midline, tonsils 1+ bilaterally.  No trismus, stridor, evidence of PTA, floor of mouth swelling or redness.   Eyes:     General:        Right eye: No discharge.        Left eye: No discharge.  Cardiovascular:     Rate and Rhythm: Normal rate and regular rhythm.  Pulmonary:     Effort: Pulmonary effort is normal. No respiratory distress.     Comments: No respiratory distress, no wheezing, stridor, rhonchi Musculoskeletal:        General: No deformity.  Skin:    General: Skin is warm and dry.  Neurological:     Mental Status: He is alert and oriented to person, place, and time.  Psychiatric:        Mood and Affect: Mood normal.        Behavior: Behavior normal.     ED Results / Procedures / Treatments   Labs (all labs ordered are listed, but only abnormal results are displayed) Labs Reviewed  RESP PANEL BY RT-PCR (RSV, FLU A&B, COVID)  [id number] - Abnormal; Notable for the following components:      Result Value   Influenza A by PCR POSITIVE (*)    All other components within normal limits    EKG None  Radiology No results found.  Procedures Procedures    Medications Ordered in ED Medications  acetaminophen (TYLENOL) tablet 650 mg (650 mg Oral Given 01/06/23 1350)    ED Course/ Medical Decision Making/ A&P                           Medical Decision Making Risk OTC drugs.   This is a well-appearing 47 yo male who presents with concern for 2 days of  cough, fever, sore throat, headache, body aches.  My emergent differential diagnosis includes acute upper respiratory infection with COVID, flu, RSV versus new asthma presentation, acute bronchitis, less clinical concern for pneumonia.  Also considered other ENT emergencies, Ludwig angina, strep pharyngitis, mono, versus epiglottis, tonsillitis versus other.  This is not an exhaustive differential.  On my exam patient is overall well-appearing, they have temperature of 101.5, breathing unlabored, no tachypnea, no respiratory distress, stable oxygen saturation.  Patient with mild tachycardia on arrival with normal rhythm. I did EKG which shows sinus tachycardia, no signs of acute ischemia.  Patient's chest pain with cough does not sound exertional in nature, his symptoms seem consistent with chest pain related to coughing, upper respiratory infection.  RVP independently reviewed by myself shows positive for flu A.  Patient symptoms are consistent with the flu encouraged ibuprofen, Tylenol, rest, plenty of fluids.  Discussed extensive return precautions.  Patient discharged in stable condition at this time.  Plan, Tussionex for symptomatic control, encourage close PCP follow-up.  Final Clinical Impression(s) / ED Diagnoses Final diagnoses:  Acute cough  Flu    Rx / DC Orders ED Discharge Orders          Ordered    chlorpheniramine-HYDROcodone (TUSSIONEX) 10-8 MG/5ML  Every 12 hours PRN        01/06/23 1429    ondansetron (ZOFRAN) 4 MG tablet  Every 6 hours        01/06/23 1429              Louie Flenner, Joesph Fillers, PA-C 01/06/23 1437    Teressa Lower, MD 01/06/23 2155

## 2023-03-15 IMAGING — DX DG CHEST 1V
1 series · 1 of 1 positions shown · non-contrast
Comparison: January 14, 2018.

CLINICAL DATA: Physical exam, history of tobacco use.

EXAM:
CHEST  1 VIEW

[chest pa]
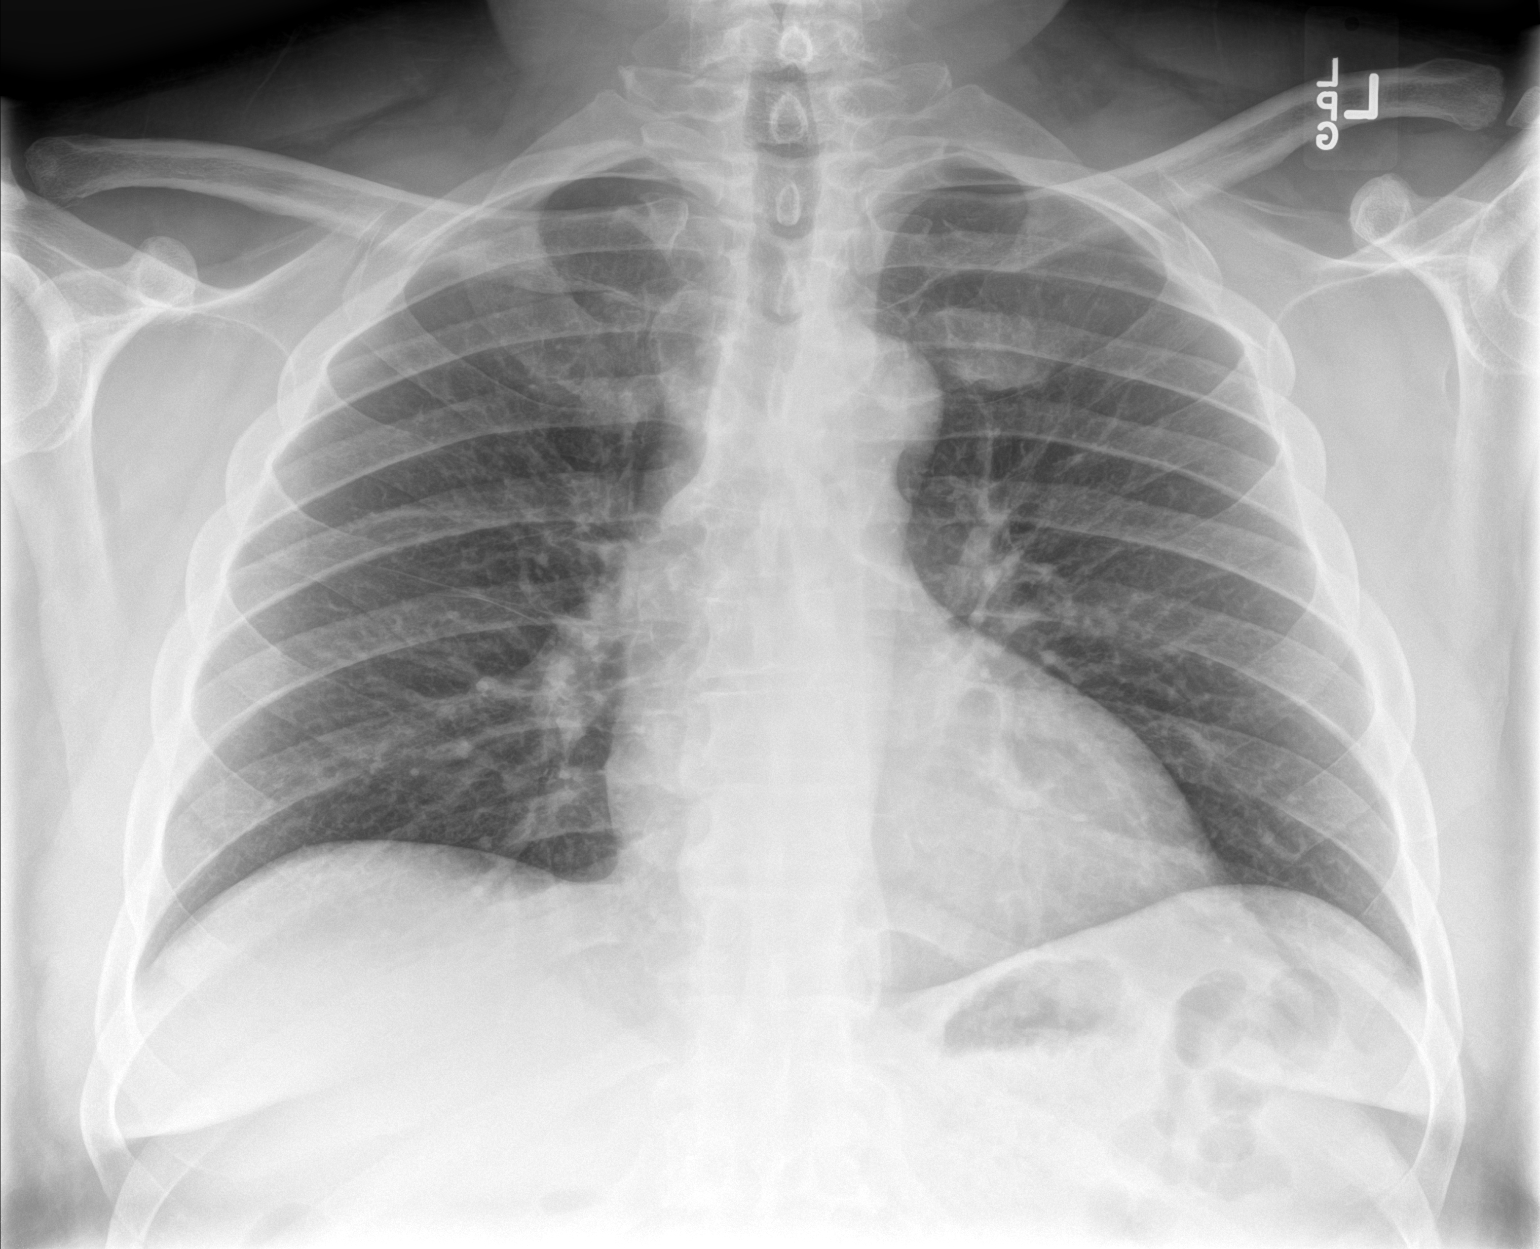

[1 of 1 positions shown; findings below may reference images not displayed]

FINDINGS: The heart size and mediastinal contours are within normal limits.
Both lungs are clear. The visualized skeletal structures are
unremarkable.
IMPRESSION: No active disease.

## 2024-03-12 ENCOUNTER — Encounter (HOSPITAL_COMMUNITY): Payer: Self-pay

## 2024-03-12 ENCOUNTER — Emergency Department (HOSPITAL_COMMUNITY)
Admission: EM | Admit: 2024-03-12 | Discharge: 2024-03-12 | Disposition: A | Discharge status: Home / Self Care | Attending: Emergency Medicine | Admitting: Emergency Medicine

## 2024-03-12 ENCOUNTER — Other Ambulatory Visit: Payer: Self-pay

## 2024-03-12 DIAGNOSIS — I10 Essential (primary) hypertension: Secondary | ICD-10-CM | POA: Insufficient documentation

## 2024-03-12 DIAGNOSIS — Z79899 Other long term (current) drug therapy: Secondary | ICD-10-CM | POA: Diagnosis not present

## 2024-03-12 DIAGNOSIS — E119 Type 2 diabetes mellitus without complications: Secondary | ICD-10-CM | POA: Insufficient documentation

## 2024-03-12 DIAGNOSIS — U071 COVID-19: Secondary | ICD-10-CM | POA: Diagnosis not present

## 2024-03-12 DIAGNOSIS — R059 Cough, unspecified: Secondary | ICD-10-CM | POA: Diagnosis present

## 2024-03-12 LAB — RESP PANEL BY RT-PCR (RSV, FLU A&B, COVID)  RVPGX2
Influenza A by PCR: NEGATIVE
Influenza B by PCR: NEGATIVE
Resp Syncytial Virus by PCR: NEGATIVE
SARS Coronavirus 2 by RT PCR: POSITIVE — AB

## 2024-03-12 MED ORDER — BENZONATATE 100 MG PO CAPS: 100.0000 mg | ORAL_CAPSULE | Freq: Three times a day (TID) | ORAL | 0 refills | Status: AC

## 2024-03-12 NOTE — ED Triage Notes (Signed)
 Pt arrived via POV c/o flu-like symptoms, dizziness, diaphoretic, body aches, dry cough and congestion since Friday.

## 2024-03-12 NOTE — ED Provider Notes (Signed)
 [facility] AT [facility] Provider Note   CSN: [id number] Arrival date & time: 03/12/24  1520     History Chief Complaint  Patient presents with   Dizziness    [facility] is a 48 y.o. male.  Patient presents emergency department today with concerns of flulike symptoms.  Endorses some dizziness, diaphoresis, body aches, dry cough and congestion starting Friday, 3 days ago.  Past history significant for hypertension and diabetes.  Patient reportedly takes medications for that.  No recent nausea, vomiting, diarrhea.  Does report a sick contact with her daughter was recently sick with cough type symptoms.   Dizziness      Home Medications Prior to Admission medications   Medication Sig Start Date End Date Taking? Authorizing Provider  benzonatate (TESSALON) 100 MG capsule Take 1 capsule (100 mg total) by mouth every 8 (eight) hours. 03/12/24  Yes Smitty Knudsen, PA-C  acetaminophen (TYLENOL) 500 MG tablet Take 1,000 mg by mouth every 6 (six) hours as needed for fever.    [provider]  albuterol (PROVENTIL HFA;VENTOLIN HFA) 108 (90 Base) MCG/ACT inhaler Inhale 1-2 puffs into the lungs every 6 (six) hours as needed for wheezing or shortness of breath. 01/04/18   Terrilee Files, MD  amLODipine (NORVASC) 10 MG tablet Take 10 mg by mouth daily. 12/03/19   [provider]  benazepril (LOTENSIN) 40 MG tablet Take 40 mg by mouth daily. 12/03/19   [provider]  benzonatate (TESSALON) 100 MG capsule Take 1 capsule (100 mg total) by mouth every 8 (eight) hours. 01/04/18   Terrilee Files, MD  chlorpheniramine-HYDROcodone (TUSSIONEX) 10-8 MG/5ML Take 5 mLs by mouth every 12 (twelve) hours as needed for cough. 01/06/23   Prosperi, Christian H, PA-C  dexamethasone (DECADRON) 6 MG tablet 1 po bid with food Patient not taking: Reported on 07/12/2015 04/15/15   Ivery Quale, PA-C  ibuprofen (ADVIL,MOTRIN) 800 MG tablet Take 1  tablet (800 mg total) by mouth 3 (three) times daily. 07/12/15   Ivery Quale, PA-C  loratadine-pseudoephedrine (CLARITIN-D 12 HOUR) 5-120 MG per tablet Take 1 tablet by mouth 2 (two) times daily. 07/12/15   Ivery Quale, PA-C  losartan-hydrochlorothiazide (HYZAAR) 100-12.5 MG per tablet Take 1 tablet by mouth daily. 08/01/14   Mirian Mo, MD  meloxicam (MOBIC) 15 MG tablet Take 1 tablet (15 mg total) by mouth daily. Patient not taking: Reported on 07/12/2015 04/15/15   Ivery Quale, PA-C  methocarbamol (ROBAXIN) 500 MG tablet Take 1 tablet (500 mg total) by mouth 2 (two) times daily. 08/05/20   Rosezella Rumpf, PA-C  methocarbamol (ROBAXIN) 500 MG tablet Take 1 tablet (500 mg total) by mouth 2 (two) times daily. 02/03/21   Mannie Stabile, PA-C  naproxen (NAPROSYN) 500 MG tablet Take 1 tablet (500 mg total) by mouth 2 (two) times daily. 08/05/20   Rosezella Rumpf, PA-C  naproxen (NAPROSYN) 500 MG tablet Take 1 tablet (500 mg total) by mouth 2 (two) times daily. 02/03/21   Mannie Stabile, PA-C  ondansetron (ZOFRAN ODT) 4 MG disintegrating tablet 4mg  ODT q4 hours prn nausea/vomit 03/04/20   Bethann Berkshire, MD  ondansetron (ZOFRAN) 4 MG tablet Take 1 tablet (4 mg total) by mouth every 6 (six) hours. 01/06/23   Prosperi, Christian H, PA-C  oxyCODONE (OXY IR/ROXICODONE) 5 MG immediate release tablet Take 5 mg by mouth 4 (four) times daily. 02/18/20   [provider]  Phenyleph-CPM-DM-Aspirin (ALKA-SELTZER PLUS COLD & COUGH  PO) Take 2 capsules by mouth every 4 (four) hours as needed (cold/cough).    [provider]  promethazine-codeine (PHENERGAN WITH CODEINE) 6.25-10 MG/5ML syrup Take 5 mLs by mouth every 6 (six) hours as needed. 07/12/15   Ivery Quale, PA-C      Allergies    Patient has no known allergies.    Review of Systems   Review of Systems  Neurological:  Positive for dizziness.  All other systems reviewed and are negative.   Physical Exam Updated Vital  Signs BP (!) 163/104   Pulse 71   Temp 98.6 F (37 C) (Temporal)   Resp 17   Ht 6\' 2"  (1.88 m)   Wt 125.6 kg   SpO2 98%   BMI 35.55 kg/m  Physical Exam Vitals and nursing note reviewed.  Constitutional:      General: He is not in acute distress.    Appearance: He is well-developed.  HENT:     Head: Normocephalic and atraumatic.  Eyes:     Conjunctiva/sclera: Conjunctivae normal.  Cardiovascular:     Rate and Rhythm: Normal rate and regular rhythm.     Heart sounds: No murmur heard. Pulmonary:     Effort: Pulmonary effort is normal. No respiratory distress.     Breath sounds: Normal breath sounds. No wheezing or rales.  Abdominal:     Palpations: Abdomen is soft.     Tenderness: There is no abdominal tenderness.  Musculoskeletal:        General: No swelling.     Cervical back: Neck supple.  Skin:    General: Skin is warm and dry.     Capillary Refill: Capillary refill takes less than 2 seconds.  Neurological:     Mental Status: He is alert.  Psychiatric:        Mood and Affect: Mood normal.     ED Results / Procedures / Treatments   Labs (all labs ordered are listed, but only abnormal results are displayed) Labs Reviewed  RESP PANEL BY RT-PCR (RSV, FLU A&B, COVID)  RVPGX2 - Abnormal; Notable for the following components:      Result Value   SARS Coronavirus 2 by RT PCR POSITIVE (*)    All other components within normal limits    EKG None  Radiology No results found.  Procedures Procedures    Medications Ordered in ED Medications - No data to display  ED Course/ Medical Decision Making/ A&P                                 Medical Decision Making Risk Prescription drug management.   This patient presents to the ED for concern of dizziness.  Differential diagnosis includes dehydration, COVID-19, pneumonia, PE, bronchitis   Lab Tests:  I Ordered, and personally interpreted labs.  The pertinent results include: Respiratory panel positive for  COVID-19   Problem List / ED Course:  Patient with past history significant for diabetes, hypertension, hyperlipidemia presents ED with concerns of dizziness.  Reports flulike symptoms starting 3 days ago with cold sweats, body aches, dry cough, congestion, dizziness.  Does endorse a daughter who was sick with a cough recently.  No fevers as far as patient is able to tell at home. Physical exam is reassuring.  No abnormal lung sounds and heart has no appreciable murmur, rub, or gallops. Given reassuring findings, I feel the patient is stable for outpatient follow-up with his findings  of being positive for COVID-19.  Patient has no evidence of respiratory distress at this time.  Encourage close follow-up with PCP for repeat assessment.  Discussed return precautions such as progression to severe shortness of breath, chest pain, or confusion.  Patient is otherwise stable for outpatient follow-up and discharged home with plans for PCP evaluation.  Discharged home in stable condition.  Final Clinical Impression(s) / ED Diagnoses Final diagnoses:  COVID-19    Rx / DC Orders ED Discharge Orders          Ordered    benzonatate (TESSALON) 100 MG capsule  Every 8 hours        03/12/24 1739              Smitty Knudsen, PA-C 03/12/24 1743    Eber Hong, MD 03/14/24 [phone]

## 2024-03-12 NOTE — Discharge Instructions (Addendum)
 You are seen in the emergency department today for concerns of dizziness.  You tested positive for COVID-19.  I suspect this is likely source of your symptoms.  I would recommend trying to isolate if possible to reduce the risk of exposing other individuals.  Return the emergency department for any new or worsening symptoms.

## 2024-09-30 ENCOUNTER — Other Ambulatory Visit: Payer: Self-pay

## 2024-09-30 ENCOUNTER — Encounter (HOSPITAL_COMMUNITY): Payer: Self-pay | Admitting: Emergency Medicine

## 2024-09-30 ENCOUNTER — Emergency Department (HOSPITAL_COMMUNITY)
Admission: EM | Admit: 2024-09-30 | Discharge: 2024-10-01 | Disposition: A | Discharge status: Home / Self Care | Payer: Self-pay | Attending: Emergency Medicine | Admitting: Emergency Medicine

## 2024-09-30 DIAGNOSIS — R739 Hyperglycemia, unspecified: Secondary | ICD-10-CM

## 2024-09-30 DIAGNOSIS — E1165 Type 2 diabetes mellitus with hyperglycemia: Secondary | ICD-10-CM | POA: Insufficient documentation

## 2024-09-30 DIAGNOSIS — E860 Dehydration: Secondary | ICD-10-CM | POA: Insufficient documentation

## 2024-09-30 LAB — CBC
HCT: 42.3 % (ref 39.0–52.0)
Hemoglobin: 14 g/dL (ref 13.0–17.0)
MCH: 29.9 pg (ref 26.0–34.0)
MCHC: 33.1 g/dL (ref 30.0–36.0)
MCV: 90.4 fL (ref 80.0–100.0)
Platelets: 196 K/uL (ref 150–400)
RBC: 4.68 MIL/uL (ref 4.22–5.81)
RDW: 13.2 % (ref 11.5–15.5)
WBC: 9 K/uL (ref 4.0–10.5)
nRBC: 0 % (ref 0.0–0.2)

## 2024-09-30 LAB — I-STAT CHEM 8, ED
BUN: 20 mg/dL (ref 6–20)
Calcium, Ion: 1.19 mmol/L (ref 1.15–1.40)
Chloride: 105 mmol/L (ref 98–111)
Creatinine, Ser: 1.4 mg/dL — ABNORMAL HIGH (ref 0.61–1.24)
Glucose, Bld: 456 mg/dL — ABNORMAL HIGH (ref 70–99)
HCT: 42 % (ref 39.0–52.0)
Hemoglobin: 14.3 g/dL (ref 13.0–17.0)
Potassium: 4.4 mmol/L (ref 3.5–5.1)
Sodium: 141 mmol/L (ref 135–145)
TCO2: 24 mmol/L (ref 22–32)

## 2024-09-30 LAB — CBG MONITORING, ED
Glucose-Capillary: 421 mg/dL — ABNORMAL HIGH (ref 70–99)
Glucose-Capillary: 406 mg/dL — ABNORMAL HIGH (ref 70–99)

## 2024-09-30 LAB — URINALYSIS, ROUTINE W REFLEX MICROSCOPIC
Bacteria, UA: NONE SEEN
Bilirubin Urine: NEGATIVE
Glucose, UA: >=500 mg/dL — AB
Hgb urine dipstick: NEGATIVE
Ketones, ur: NEGATIVE mg/dL
Leukocytes,Ua: NEGATIVE
Nitrite: NEGATIVE
Protein, ur: NEGATIVE mg/dL
Specific Gravity, Urine: 1.026 (ref 1.005–1.030)
pH: 6 (ref 5.0–8.0)

## 2024-09-30 LAB — BLOOD GAS, VENOUS
Acid-Base Excess: 4.2 mmol/L — ABNORMAL HIGH (ref 0.0–2.0)
Bicarbonate: 29.8 mmol/L — ABNORMAL HIGH (ref 20.0–28.0)
Drawn by: 57449
O2 Saturation: 92.5 %
Patient temperature: 36.6
pCO2, Ven: 46 mmHg (ref 44–60)
pH, Ven: 7.42 (ref 7.25–7.43)
pO2, Ven: 62 mmHg — ABNORMAL HIGH (ref 32–45)

## 2024-09-30 MED ORDER — SODIUM CHLORIDE 0.9 % IV BOLUS
1000.0000 mL | Freq: Once | INTRAVENOUS | Status: AC
  Administered 2024-09-30: 1000 mL via INTRAVENOUS

## 2024-09-30 NOTE — ED Triage Notes (Signed)
 Pt bib EMS after he called with c/o feeling woozy since waking up today and dry mouth for past couple of days. EMS states pt is diabetic and takes an oral medication for it. CBG reported by EMS was 479.

## 2024-10-01 LAB — COMPREHENSIVE METABOLIC PANEL WITH GFR
ALT: 23 U/L (ref 0–44)
AST: 19 U/L (ref 15–41)
Albumin: 4.1 g/dL (ref 3.5–5.0)
Alkaline Phosphatase: 126 U/L (ref 38–126)
Anion gap: 14 (ref 5–15)
BUN: 20 mg/dL (ref 6–20)
CO2: 23 mmol/L (ref 22–32)
Calcium: 9.4 mg/dL (ref 8.9–10.3)
Chloride: 103 mmol/L (ref 98–111)
Creatinine, Ser: 1.52 mg/dL — ABNORMAL HIGH (ref 0.61–1.24)
GFR, Estimated: 56 mL/min — ABNORMAL LOW (ref >60)
Glucose, Bld: 456 mg/dL — ABNORMAL HIGH (ref 70–99)
Potassium: 4.5 mmol/L (ref 3.5–5.1)
Sodium: 141 mmol/L (ref 135–145)
Total Bilirubin: 0.3 mg/dL (ref 0.0–1.2)
Total Protein: 6.7 g/dL (ref 6.5–8.1)

## 2024-10-01 LAB — CBG MONITORING, ED
Glucose-Capillary: 361 mg/dL — ABNORMAL HIGH (ref 70–99)
Glucose-Capillary: 373 mg/dL — ABNORMAL HIGH (ref 70–99)

## 2024-10-01 MED ORDER — INSULIN ASPART 100 UNIT/ML IJ SOLN
5.0000 [IU] | Freq: Once | INTRAMUSCULAR | Status: AC
  Administered 2024-10-01: 5 [IU] via SUBCUTANEOUS
  Filled 2024-10-01: qty 1

## 2024-10-01 MED ORDER — SODIUM CHLORIDE 0.9 % IV BOLUS
1000.0000 mL | Freq: Once | INTRAVENOUS | Status: AC
  Administered 2024-10-01: 1000 mL via INTRAVENOUS

## 2024-10-01 NOTE — ED Provider Notes (Signed)
 [place] EMERGENCY DEPARTMENT AT [facility] Provider Note   CSN: [id number] Arrival date & time: 09/30/24  2107     Patient presents with: Dry Mouth and Hyperglycemia   JETT FUKUDA is a 48 y.o. male.   Patient is a 48 year old male with past medical history of diabetes currently on Jardiance presenting for complaints of lethargy, polyuria, and polydipsia.  Blood sugar on arrival 479.  Patient denies any fevers, chills, coughing, nausea, vomiting, abdominal pain, or dysuria.  Denies any missed doses of his Jardiance.  States he used to be on metformin but is no longer taking it as recommended by his PCP.  Has never been on insulin.  The history is provided by the patient. No language interpreter was used.  Hyperglycemia Associated symptoms: fatigue, increased thirst and polyuria   Associated symptoms: no abdominal pain, no chest pain, no dysuria, no fever, no shortness of breath and no vomiting        Prior to Admission medications   Medication Sig Start Date End Date Taking? Authorizing Provider  acetaminophen  (TYLENOL ) 500 MG tablet Take 1,000 mg by mouth every 6 (six) hours as needed for fever.    [provider]  albuterol  (PROVENTIL  HFA;VENTOLIN  HFA) 108 (90 Base) MCG/ACT inhaler Inhale 1-2 puffs into the lungs every 6 (six) hours as needed for wheezing or shortness of breath. 01/04/18   Butler, Michael C, MD  amLODipine (NORVASC) 10 MG tablet Take 10 mg by mouth daily. 12/03/19   [provider]  benazepril (LOTENSIN) 40 MG tablet Take 40 mg by mouth daily. 12/03/19   [provider]  benzonatate  (TESSALON ) 100 MG capsule Take 1 capsule (100 mg total) by mouth every 8 (eight) hours. 01/04/18   Towana Ozell BROCKS, MD  benzonatate  (TESSALON ) 100 MG capsule Take 1 capsule (100 mg total) by mouth every 8 (eight) hours. 03/12/24   Zelaya, Oscar A, PA-C  chlorpheniramine-HYDROcodone  (TUSSIONEX) 10-8 MG/5ML Take 5 mLs by mouth every 12 (twelve) hours  as needed for cough. 01/06/23   Prosperi, Christian H, PA-C  dexamethasone  (DECADRON ) 6 MG tablet 1 po bid with food Patient not taking: Reported on 07/12/2015 04/15/15   Armida Culver, PA-C  ibuprofen  (ADVIL ,MOTRIN ) 800 MG tablet Take 1 tablet (800 mg total) by mouth 3 (three) times daily. 07/12/15   Armida Culver, PA-C  loratadine -pseudoephedrine  (CLARITIN -D 12 HOUR) 5-120 MG per tablet Take 1 tablet by mouth 2 (two) times daily. 07/12/15   Armida Culver, PA-C  losartan -hydrochlorothiazide (HYZAAR) 100-12.5 MG per tablet Take 1 tablet by mouth daily. 08/01/14   Deanna Cough, MD  meloxicam  (MOBIC ) 15 MG tablet Take 1 tablet (15 mg total) by mouth daily. Patient not taking: Reported on 07/12/2015 04/15/15   Armida Culver, PA-C  methocarbamol  (ROBAXIN ) 500 MG tablet Take 1 tablet (500 mg total) by mouth 2 (two) times daily. 08/05/20   Kehrli, Kelsey F, PA-C  methocarbamol  (ROBAXIN ) 500 MG tablet Take 1 tablet (500 mg total) by mouth 2 (two) times daily. 02/03/21   Aberman, Caroline C, PA-C  naproxen  (NAPROSYN ) 500 MG tablet Take 1 tablet (500 mg total) by mouth 2 (two) times daily. 08/05/20   Kehrli, Kelsey F, PA-C  naproxen  (NAPROSYN ) 500 MG tablet Take 1 tablet (500 mg total) by mouth 2 (two) times daily. 02/03/21   Aberman, Caroline C, PA-C  ondansetron  (ZOFRAN  ODT) 4 MG disintegrating tablet 4mg  ODT q4 hours prn nausea/vomit 03/04/20   Zammit, Joseph, MD  ondansetron  (ZOFRAN ) 4 MG tablet Take 1  tablet (4 mg total) by mouth every 6 (six) hours. 01/06/23   Prosperi, Christian H, PA-C  oxyCODONE (OXY IR/ROXICODONE) 5 MG immediate release tablet Take 5 mg by mouth 4 (four) times daily. 02/18/20   [provider]  Phenyleph-CPM-DM-Aspirin (ALKA-SELTZER PLUS COLD & COUGH PO) Take 2 capsules by mouth every 4 (four) hours as needed (cold/cough).    [provider]  promethazine -codeine  (PHENERGAN  WITH CODEINE ) 6.25-10 MG/5ML syrup Take 5 mLs by mouth every 6 (six) hours as needed. 07/12/15   Armida Culver, PA-C    Allergies: Patient has no known allergies.    Review of Systems  Constitutional:  Positive for fatigue. Negative for chills and fever.  HENT:  Negative for ear pain and sore throat.   Eyes:  Negative for pain and visual disturbance.  Respiratory:  Negative for cough and shortness of breath.   Cardiovascular:  Negative for chest pain and palpitations.  Gastrointestinal:  Negative for abdominal pain and vomiting.  Endocrine: Positive for polydipsia and polyuria.  Genitourinary:  Negative for dysuria and hematuria.  Musculoskeletal:  Negative for arthralgias and back pain.  Skin:  Negative for color change and rash.  Neurological:  Negative for seizures and syncope.  All other systems reviewed and are negative.   Updated Vital Signs BP 128/81   Pulse 67   Temp 97.7 F (36.5 C) (Oral)   Resp 18   Ht 6' 2 (1.88 m)   Wt 127 kg   SpO2 96%   BMI 35.95 kg/m   Physical Exam Vitals and nursing note reviewed.  Constitutional:      General: He is not in acute distress.    Appearance: He is well-developed.  HENT:     Head: Normocephalic and atraumatic.  Eyes:     Conjunctiva/sclera: Conjunctivae normal.  Cardiovascular:     Rate and Rhythm: Normal rate and regular rhythm.     Heart sounds: No murmur heard. Pulmonary:     Effort: Pulmonary effort is normal. No respiratory distress.     Breath sounds: Normal breath sounds.  Abdominal:     Palpations: Abdomen is soft.     Tenderness: There is no abdominal tenderness.  Musculoskeletal:        General: No swelling.     Cervical back: Neck supple.  Skin:    General: Skin is warm and dry.     Capillary Refill: Capillary refill takes less than 2 seconds.  Neurological:     Mental Status: He is alert and oriented to person, place, and time.     GCS: GCS eye subscore is 4. GCS verbal subscore is 5. GCS motor subscore is 6.     Cranial Nerves: Cranial nerves 2-12 are intact.     Sensory: Sensation is intact.      Motor: Motor function is intact.     Coordination: Coordination is intact.  Psychiatric:        Mood and Affect: Mood normal.     (all labs ordered are listed, but only abnormal results are displayed) Labs Reviewed  URINALYSIS, ROUTINE W REFLEX MICROSCOPIC - Abnormal; Notable for the following components:      Result Value   Color, Urine STRAW (*)    Glucose, UA >=500 (*)    All other components within normal limits  BLOOD GAS, VENOUS - Abnormal; Notable for the following components:   pO2, Ven 62 (*)    Bicarbonate 29.8 (*)    Acid-Base Excess 4.2 (*)  All other components within normal limits  CBG MONITORING, ED - Abnormal; Notable for the following components:   Glucose-Capillary 421 (*)    All other components within normal limits  I-STAT CHEM 8, ED - Abnormal; Notable for the following components:   Creatinine, Ser 1.40 (*)    Glucose, Bld 456 (*)    All other components within normal limits  CBG MONITORING, ED - Abnormal; Notable for the following components:   Glucose-Capillary 406 (*)    All other components within normal limits  CBC  COMPREHENSIVE METABOLIC PANEL WITH GFR    EKG: None  Radiology: No results found.   Procedures   Medications Ordered in the ED  insulin aspart (novoLOG) injection 5 Units (has no administration in time range)  sodium chloride  0.9 % bolus 1,000 mL (has no administration in time range)  sodium chloride  0.9 % bolus 1,000 mL (1,000 mLs Intravenous New Bag/Given 09/30/24 2319)                                    Medical Decision Making Amount and/or Complexity of Data Reviewed Labs: ordered.  Risk Prescription drug management.    48 year old male with past medical history of diabetes currently on Jardiance presenting for complaints of lethargy, polyuria, and polydipsia.  Patient is alert and redemonstrate, no acute distress, afebrile, stable vital signs.  Physical exam demonstrates no neurovascular deficits.  No signs or  symptoms of sepsis.  Stable pH.  Patient given IV fluids and replete glucose 406.  5 units of IV insulin initiated.  Patient given additional fluid unit.  Electrolytes are stable.  Patient signed out to oncoming physician while awaiting fluids and insulin.     Final diagnoses:  Dehydration  Hyperglycemia    ED Discharge Orders     None          Elnor Bernarda SQUIBB, DO 10/01/24 0002

## 2024-10-01 NOTE — Discharge Instructions (Signed)
 Follow-up with your primary care physician for hyperglycemia and the potential need for insulin

## 2024-10-01 NOTE — ED Notes (Signed)
 Pt educated on staying hydrated. Pt educated to follow up with PCP ASAP. Pt verbalizes understanding.

## 2024-10-12 ENCOUNTER — Other Ambulatory Visit: Payer: Self-pay

## 2024-10-12 ENCOUNTER — Emergency Department (HOSPITAL_COMMUNITY)
Admission: EM | Admit: 2024-10-12 | Discharge: 2024-10-12 | Disposition: A | Discharge status: Home / Self Care | Payer: Self-pay | Attending: Emergency Medicine | Admitting: Emergency Medicine

## 2024-10-12 ENCOUNTER — Encounter (HOSPITAL_COMMUNITY): Payer: Self-pay | Admitting: Emergency Medicine

## 2024-10-12 DIAGNOSIS — D72829 Elevated white blood cell count, unspecified: Secondary | ICD-10-CM | POA: Insufficient documentation

## 2024-10-12 DIAGNOSIS — R739 Hyperglycemia, unspecified: Secondary | ICD-10-CM | POA: Insufficient documentation

## 2024-10-12 LAB — CBC WITH DIFFERENTIAL/PLATELET
Abs Immature Granulocytes: 0.04 K/uL (ref 0.00–0.07)
Basophils Absolute: 0.1 K/uL (ref 0.0–0.1)
Basophils Relative: 1 %
Eosinophils Absolute: 0.1 K/uL (ref 0.0–0.5)
Eosinophils Relative: 1 %
HCT: 44.7 % (ref 39.0–52.0)
Hemoglobin: 14.7 g/dL (ref 13.0–17.0)
Immature Granulocytes: 0 %
Lymphocytes Relative: 14 %
Lymphs Abs: 1.6 K/uL (ref 0.7–4.0)
MCH: 29.8 pg (ref 26.0–34.0)
MCHC: 32.9 g/dL (ref 30.0–36.0)
MCV: 90.7 fL (ref 80.0–100.0)
Monocytes Absolute: 1.1 K/uL — ABNORMAL HIGH (ref 0.1–1.0)
Monocytes Relative: 10 %
Neutro Abs: 8.7 K/uL — ABNORMAL HIGH (ref 1.7–7.7)
Neutrophils Relative %: 74 %
Platelets: 185 K/uL (ref 150–400)
RBC: 4.93 MIL/uL (ref 4.22–5.81)
RDW: 13.2 % (ref 11.5–15.5)
WBC: 11.7 K/uL — ABNORMAL HIGH (ref 4.0–10.5)
nRBC: 0 % (ref 0.0–0.2)

## 2024-10-12 LAB — COMPREHENSIVE METABOLIC PANEL WITH GFR
ALT: 19 U/L (ref 0–44)
AST: 16 U/L (ref 15–41)
Albumin: 4.2 g/dL (ref 3.5–5.0)
Alkaline Phosphatase: 186 U/L — ABNORMAL HIGH (ref 38–126)
Anion gap: 13 (ref 5–15)
BUN: 15 mg/dL (ref 6–20)
CO2: 24 mmol/L (ref 22–32)
Calcium: 9.1 mg/dL (ref 8.9–10.3)
Chloride: 99 mmol/L (ref 98–111)
Creatinine, Ser: 1.51 mg/dL — ABNORMAL HIGH (ref 0.61–1.24)
GFR, Estimated: 56 mL/min — ABNORMAL LOW (ref >60)
Glucose, Bld: 508 mg/dL (ref 70–99)
Potassium: 4.1 mmol/L (ref 3.5–5.1)
Sodium: 136 mmol/L (ref 135–145)
Total Bilirubin: 0.5 mg/dL (ref 0.0–1.2)
Total Protein: 7.2 g/dL (ref 6.5–8.1)

## 2024-10-12 LAB — URINALYSIS, ROUTINE W REFLEX MICROSCOPIC
Bacteria, UA: NONE SEEN
Bilirubin Urine: NEGATIVE
Glucose, UA: >=500 mg/dL — AB
Hgb urine dipstick: NEGATIVE
Ketones, ur: NEGATIVE mg/dL
Leukocytes,Ua: NEGATIVE
Nitrite: NEGATIVE
Protein, ur: NEGATIVE mg/dL
Specific Gravity, Urine: 1.029 (ref 1.005–1.030)
pH: 5 (ref 5.0–8.0)

## 2024-10-12 LAB — BLOOD GAS, VENOUS
Acid-Base Excess: 1.5 mmol/L (ref 0.0–2.0)
Bicarbonate: 25.9 mmol/L (ref 20.0–28.0)
Drawn by: 57449
O2 Saturation: 94.4 %
Patient temperature: 37.1
pCO2, Ven: 39 mmHg — ABNORMAL LOW (ref 44–60)
pH, Ven: 7.43 (ref 7.25–7.43)
pO2, Ven: 68 mmHg — ABNORMAL HIGH (ref 32–45)

## 2024-10-12 LAB — CBG MONITORING, ED
Glucose-Capillary: 484 mg/dL — ABNORMAL HIGH (ref 70–99)
Glucose-Capillary: 379 mg/dL — ABNORMAL HIGH (ref 70–99)
Glucose-Capillary: 308 mg/dL — ABNORMAL HIGH (ref 70–99)

## 2024-10-12 MED ORDER — METFORMIN HCL 500 MG PO TABS: ORAL_TABLET | ORAL | 1 refills | Status: AC

## 2024-10-12 MED ORDER — INSULIN ASPART 100 UNIT/ML IJ SOLN
15.0000 [IU] | Freq: Once | INTRAMUSCULAR | Status: AC
  Administered 2024-10-12: 15 [IU] via SUBCUTANEOUS
  Filled 2024-10-12: qty 1

## 2024-10-12 MED ORDER — SODIUM CHLORIDE 0.9 % IV BOLUS
1000.0000 mL | Freq: Once | INTRAVENOUS | Status: AC
  Administered 2024-10-12: 1000 mL via INTRAVENOUS

## 2024-10-12 MED ORDER — METFORMIN HCL 500 MG PO TABS: ORAL_TABLET | ORAL | 1 refills | Status: DC

## 2024-10-12 NOTE — ED Provider Notes (Signed)
 [place] EMERGENCY DEPARTMENT AT [facility] Provider Note   CSN: [id number] Arrival date & time: 10/12/24  1856     Patient presents with: Hyperglycemia   Grant Anderson is a 48 y.o. male.   Patient is a 48 year old male who presents to Emergency Department with a chief complaint of elevated blood sugar.  He was seen in the emergency department approximate 2 weeks ago for the same complaint.  He notes that he is currently on Jardiance at this point but spouse notes that he has not been compliant with this.  He notes he is not currently on any other medications at this time.  Patient notes that he is experiencing generalized fatigue as well as polyuria and polydipsia.  He denies any chest pain, abdominal pain, nausea, vomiting, diarrhea.  He notes that he has not seen his primary care doctor in approximately 3 months.   Hyperglycemia Associated symptoms: increased thirst and polyuria        Prior to Admission medications   Medication Sig Start Date End Date Taking? Authorizing Provider  metFORMIN (GLUCOPHAGE) 500 MG tablet Take 1 tablet daily by mouth for 1 week, increase to 1 tablet twice a day thereafter 10/12/24  Yes Beatrix Breece D, PA-C  acetaminophen  (TYLENOL ) 500 MG tablet Take 1,000 mg by mouth every 6 (six) hours as needed for fever.    [provider]  albuterol  (PROVENTIL  HFA;VENTOLIN  HFA) 108 (90 Base) MCG/ACT inhaler Inhale 1-2 puffs into the lungs every 6 (six) hours as needed for wheezing or shortness of breath. 01/04/18   Butler, Michael C, MD  amLODipine (NORVASC) 10 MG tablet Take 10 mg by mouth daily. 12/03/19   [provider]  benazepril (LOTENSIN) 40 MG tablet Take 40 mg by mouth daily. 12/03/19   [provider]  benzonatate  (TESSALON ) 100 MG capsule Take 1 capsule (100 mg total) by mouth every 8 (eight) hours. 01/04/18   Towana Ozell BROCKS, MD  benzonatate  (TESSALON ) 100 MG capsule Take 1 capsule (100 mg total) by mouth  every 8 (eight) hours. 03/12/24   Zelaya, Oscar A, PA-C  chlorpheniramine-HYDROcodone  (TUSSIONEX) 10-8 MG/5ML Take 5 mLs by mouth every 12 (twelve) hours as needed for cough. 01/06/23   Prosperi, Christian H, PA-C  dexamethasone  (DECADRON ) 6 MG tablet 1 po bid with food Patient not taking: Reported on 07/12/2015 04/15/15   Armida Culver, PA-C  ibuprofen  (ADVIL ,MOTRIN ) 800 MG tablet Take 1 tablet (800 mg total) by mouth 3 (three) times daily. 07/12/15   Armida Culver, PA-C  loratadine -pseudoephedrine  (CLARITIN -D 12 HOUR) 5-120 MG per tablet Take 1 tablet by mouth 2 (two) times daily. 07/12/15   Armida Culver, PA-C  losartan -hydrochlorothiazide (HYZAAR) 100-12.5 MG per tablet Take 1 tablet by mouth daily. 08/01/14   Deanna Cough, MD  meloxicam  (MOBIC ) 15 MG tablet Take 1 tablet (15 mg total) by mouth daily. Patient not taking: Reported on 07/12/2015 04/15/15   Armida Culver, PA-C  methocarbamol  (ROBAXIN ) 500 MG tablet Take 1 tablet (500 mg total) by mouth 2 (two) times daily. 08/05/20   Kehrli, Kelsey F, PA-C  methocarbamol  (ROBAXIN ) 500 MG tablet Take 1 tablet (500 mg total) by mouth 2 (two) times daily. 02/03/21   Aberman, Caroline C, PA-C  naproxen  (NAPROSYN ) 500 MG tablet Take 1 tablet (500 mg total) by mouth 2 (two) times daily. 08/05/20   Kehrli, Kelsey F, PA-C  naproxen  (NAPROSYN ) 500 MG tablet Take 1 tablet (500 mg total) by mouth 2 (two) times daily. 02/03/21  Aberman, Caroline C, PA-C  ondansetron  (ZOFRAN  ODT) 4 MG disintegrating tablet 4mg  ODT q4 hours prn nausea/vomit 03/04/20   Zammit, Joseph, MD  ondansetron  (ZOFRAN ) 4 MG tablet Take 1 tablet (4 mg total) by mouth every 6 (six) hours. 01/06/23   Prosperi, Christian H, PA-C  oxyCODONE (OXY IR/ROXICODONE) 5 MG immediate release tablet Take 5 mg by mouth 4 (four) times daily. 02/18/20   [provider]  Phenyleph-CPM-DM-Aspirin (ALKA-SELTZER PLUS COLD & COUGH PO) Take 2 capsules by mouth every 4 (four) hours as needed (cold/cough).    [provider]  promethazine -codeine  (PHENERGAN  WITH CODEINE ) 6.25-10 MG/5ML syrup Take 5 mLs by mouth every 6 (six) hours as needed. 07/12/15   Armida Culver, PA-C    Allergies: Patient has no known allergies.    Review of Systems  Endocrine: Positive for polydipsia and polyuria.  All other systems reviewed and are negative.   Updated Vital Signs BP (!) 158/99 (BP Location: Right Arm)   Pulse 92   Temp 98.6 F (37 C) (Oral)   Resp 18   Ht 6' 2 (1.88 m)   Wt 127 kg   SpO2 98%   BMI 35.95 kg/m   Physical Exam Vitals and nursing note reviewed.  Constitutional:      General: He is not in acute distress.    Appearance: Normal appearance. He is not ill-appearing.  HENT:     Head: Normocephalic and atraumatic.     Nose: Nose normal.     Mouth/Throat:     Mouth: Mucous membranes are moist.  Eyes:     Extraocular Movements: Extraocular movements intact.     Conjunctiva/sclera: Conjunctivae normal.     Pupils: Pupils are equal, round, and reactive to light.  Cardiovascular:     Rate and Rhythm: Normal rate and regular rhythm.     Pulses: Normal pulses.     Heart sounds: Normal heart sounds. No murmur heard.    No gallop.  Pulmonary:     Effort: Pulmonary effort is normal. No respiratory distress.     Breath sounds: Normal breath sounds. No stridor. No wheezing, rhonchi or rales.  Abdominal:     General: Abdomen is flat. Bowel sounds are normal. There is no distension.     Palpations: Abdomen is soft.     Tenderness: There is no abdominal tenderness. There is no guarding.  Musculoskeletal:        General: Normal range of motion.     Cervical back: Normal range of motion and neck supple. No rigidity or tenderness.  Skin:    General: Skin is warm and dry.  Neurological:     General: No focal deficit present.     Mental Status: He is alert and oriented to person, place, and time. Mental status is at baseline.  Psychiatric:        Mood and Affect: Mood normal.         Behavior: Behavior normal.        Thought Content: Thought content normal.        Judgment: Judgment normal.     (all labs ordered are listed, but only abnormal results are displayed) Labs Reviewed  COMPREHENSIVE METABOLIC PANEL WITH GFR - Abnormal; Notable for the following components:      Result Value   Glucose, Bld 508 (*)    Creatinine, Ser 1.51 (*)    Alkaline Phosphatase 186 (*)    GFR, Estimated 56 (*)    All other components within normal  limits  CBC WITH DIFFERENTIAL/PLATELET - Abnormal; Notable for the following components:   WBC 11.7 (*)    Neutro Abs 8.7 (*)    Monocytes Absolute 1.1 (*)    All other components within normal limits  URINALYSIS, ROUTINE W REFLEX MICROSCOPIC - Abnormal; Notable for the following components:   Color, Urine STRAW (*)    Glucose, UA >=500 (*)    All other components within normal limits  BLOOD GAS, VENOUS - Abnormal; Notable for the following components:   pCO2, Ven 39 (*)    pO2, Ven 68 (*)    All other components within normal limits  CBG MONITORING, ED - Abnormal; Notable for the following components:   Glucose-Capillary 484 (*)    All other components within normal limits  CBG MONITORING, ED - Abnormal; Notable for the following components:   Glucose-Capillary 379 (*)    All other components within normal limits  CBG MONITORING, ED - Abnormal; Notable for the following components:   Glucose-Capillary 308 (*)    All other components within normal limits  BETA-HYDROXYBUTYRIC ACID    EKG: EKG Interpretation Date/Time:  Friday October 12 2024 19:58:20 EDT Ventricular Rate:  73 PR Interval:  169 QRS Duration:  91 QT Interval:  375 QTC Calculation: 414 R Axis:   62  Text Interpretation: Sinus rhythm nonspecific t wave changes from prior 10/25 Confirmed by Towana Sharper [phone]) on 10/12/2024 8:02:30 PM  Radiology: No results found.   Procedures   Medications Ordered in the ED  sodium chloride  0.9 % bolus 1,000 mL (0  mLs Intravenous Stopped 10/12/24 2211)  insulin aspart (novoLOG) injection 15 Units (15 Units Subcutaneous Given 10/12/24 2022)  sodium chloride  0.9 % bolus 1,000 mL (1,000 mLs Intravenous New Bag/Given 10/12/24 2022)                                    Medical Decision Making Patient is doing much better at this time and is stable for discharge home.  Patient does have hyperglycemia in the emergency department with no indication for DKA or HHS.  Vital signs are stable at this point with no indication for sepsis.  Low suspicion for underlying infectious source at this time.  Will add metformin onto his Jardiance at this time.  Discussed the importance of close follow-up with his primary care doctor for continued management of his diabetes on an outpatient basis.  Blood sugars greatly improved with treatment in the emergency department.  Strict turn precautions were provided for any new or worsening symptoms.  Patient voiced understanding and had no additional questions.  Amount and/or Complexity of Data Reviewed Labs: ordered.  Risk Prescription drug management.        Final diagnoses:  Hyperglycemia    ED Discharge Orders          Ordered    metFORMIN (GLUCOPHAGE) 500 MG tablet        10/12/24 2221               Xzaiver, Vayda 10/12/24 2226    Towana Sharper BROCKS, MD 10/13/24 1019

## 2024-10-12 NOTE — Discharge Instructions (Signed)
 Please start taking the metformin as directed.  Follow-up closely with your primary care doctor on an outpatient basis for reevaluation.  Return to emergency department immediately for any new or worsening symptoms.

## 2024-10-12 NOTE — ED Triage Notes (Signed)
 Pt the ED with complaints of Hyperglycemia for the past 2 weeks.  Pt was seen for the same on 09/30/24.  Pt states his CBG was 475 when he left his home prior to arrival.

## 2024-10-13 LAB — BETA-HYDROXYBUTYRIC ACID: Beta-Hydroxybutyric Acid: 0.07 mmol/L (ref 0.05–0.27)

## 2024-11-24 ENCOUNTER — Emergency Department (HOSPITAL_COMMUNITY)
Admission: EM | Admit: 2024-11-24 | Discharge: 2024-11-24 | Disposition: A | Discharge status: Home / Self Care | Payer: Self-pay | Attending: Emergency Medicine | Admitting: Emergency Medicine

## 2024-11-24 ENCOUNTER — Emergency Department (HOSPITAL_COMMUNITY): Payer: Self-pay

## 2024-11-24 ENCOUNTER — Encounter (HOSPITAL_COMMUNITY): Payer: Self-pay

## 2024-11-24 DIAGNOSIS — Z79899 Other long term (current) drug therapy: Secondary | ICD-10-CM | POA: Insufficient documentation

## 2024-11-24 DIAGNOSIS — I10 Essential (primary) hypertension: Secondary | ICD-10-CM | POA: Insufficient documentation

## 2024-11-24 DIAGNOSIS — Z7984 Long term (current) use of oral hypoglycemic drugs: Secondary | ICD-10-CM | POA: Insufficient documentation

## 2024-11-24 DIAGNOSIS — M25551 Pain in right hip: Secondary | ICD-10-CM | POA: Insufficient documentation

## 2024-11-24 DIAGNOSIS — E119 Type 2 diabetes mellitus without complications: Secondary | ICD-10-CM | POA: Insufficient documentation

## 2024-11-24 MED ORDER — HYDROCODONE-ACETAMINOPHEN 5-325 MG PO TABS
1.0000 | ORAL_TABLET | Freq: Once | ORAL | Status: AC
Start: 2024-11-24 — End: 2024-11-24
  Administered 2024-11-24: 1 via ORAL
  Filled 2024-11-24: qty 1

## 2024-11-24 MED ORDER — HYDROCODONE-ACETAMINOPHEN 5-325 MG PO TABS: 1.0000 | ORAL_TABLET | ORAL | 0 refills | Status: AC | PRN

## 2024-11-24 MED ORDER — NAPROXEN 250 MG PO TABS
500.0000 mg | ORAL_TABLET | Freq: Once | ORAL | Status: AC
Start: 2024-11-24 — End: 2024-11-24
  Administered 2024-11-24: 500 mg via ORAL
  Filled 2024-11-24: qty 2

## 2024-11-24 MED ORDER — NAPROXEN 500 MG PO TABS: 500.0000 mg | ORAL_TABLET | Freq: Two times a day (BID) | ORAL | 0 refills | Status: AC

## 2024-11-24 MED ORDER — IBUPROFEN 800 MG PO TABS
800.0000 mg | ORAL_TABLET | Freq: Once | ORAL | Status: DC
Start: 2024-11-24 — End: 2024-11-24

## 2024-11-24 NOTE — ED Notes (Addendum)
 Pt/family received d/c paperwork at this time. After going over the paperwork any questions, comments, or concerns were answered to the best of this nurse's knowledge. The pt/family verbally acknowledged the teachings/instructions.   Crutches where given at the time of d/c.  Pt has a ride home

## 2024-11-24 NOTE — ED Triage Notes (Signed)
 Pt comes in for right hip pain. Pt denies any injury to the area. Pt woke up with the pain. Pain has been going on for a month. PMS intact. Pt limped to triage from lobby.

## 2024-11-24 NOTE — Discharge Instructions (Signed)
 Your x-ray suggest some mild arthritis in your hip but also some suggestion of inflammation as well.  You are being prescribed medication, I recommend activity as tolerated.  You may find the crutches helpful to minimize weight bearing, use these as needed.  I also suggest using ice is much as is comfortable, applying heat 20 minutes 3 times daily may also help speed this healing process.

## 2024-11-26 NOTE — ED Provider Notes (Signed)
 [place] EMERGENCY DEPARTMENT AT [facility] Provider Note   CSN: [id number] Arrival date & time: 11/24/24  1143     Patient presents with: Hip Pain (right)   Grant Anderson is a 48 y.o. male with a history including diabetes, hypertension hyperlipidemia presenting for evaluation of right hip pain.  His pain has been present for about a month but states it has gradually worsened particularly this morning.  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  He denies fevers, chills, rash, swelling, no IVDU, no injuries.   The history is provided by the patient.       Prior to Admission medications   Medication Sig Start Date End Date Taking? Authorizing Provider  HYDROcodone -acetaminophen  (NORCO/VICODIN) 5-325 MG tablet Take 1 tablet by mouth every 4 (four) hours as needed. 11/24/24  Yes Aimar Shrewsbury, PA-C  naproxen  (NAPROSYN ) 500 MG tablet Take 1 tablet (500 mg total) by mouth 2 (two) times daily. 11/24/24  Yes Lorelee Mclaurin, PA-C  acetaminophen  (TYLENOL ) 500 MG tablet Take 1,000 mg by mouth every 6 (six) hours as needed for fever.    [provider]  albuterol  (PROVENTIL  HFA;VENTOLIN  HFA) 108 (90 Base) MCG/ACT inhaler Inhale 1-2 puffs into the lungs every 6 (six) hours as needed for wheezing or shortness of breath. 01/04/18   Butler, Michael C, MD  amLODipine (NORVASC) 10 MG tablet Take 10 mg by mouth daily. 12/03/19   [provider]  benazepril (LOTENSIN) 40 MG tablet Take 40 mg by mouth daily. 12/03/19   [provider]  benzonatate  (TESSALON ) 100 MG capsule Take 1 capsule (100 mg total) by mouth every 8 (eight) hours. 01/04/18   Towana Ozell BROCKS, MD  benzonatate  (TESSALON ) 100 MG capsule Take 1 capsule (100 mg total) by mouth every 8 (eight) hours. 03/12/24   Zelaya, Oscar A, PA-C  chlorpheniramine-HYDROcodone  (TUSSIONEX) 10-8 MG/5ML Take 5 mLs by mouth every 12 (twelve) hours as needed for cough. 01/06/23    Prosperi, Christian H, PA-C  dexamethasone  (DECADRON ) 6 MG tablet 1 po bid with food Patient not taking: Reported on 07/12/2015 04/15/15   Armida Culver, PA-C  ibuprofen  (ADVIL ,MOTRIN ) 800 MG tablet Take 1 tablet (800 mg total) by mouth 3 (three) times daily. 07/12/15   Armida Culver, PA-C  loratadine -pseudoephedrine  (CLARITIN -D 12 HOUR) 5-120 MG per tablet Take 1 tablet by mouth 2 (two) times daily. 07/12/15   Armida Culver, PA-C  losartan -hydrochlorothiazide (HYZAAR) 100-12.5 MG per tablet Take 1 tablet by mouth daily. 08/01/14   Deanna Cough, MD  meloxicam  (MOBIC ) 15 MG tablet Take 1 tablet (15 mg total) by mouth daily. Patient not taking: Reported on 07/12/2015 04/15/15   Armida Culver, PA-C  metFORMIN (GLUCOPHAGE) 500 MG tablet Take 1 tablet daily by mouth for 1 week, increase to 1 tablet twice a day thereafter 10/12/24   Daralene Lonni BIRCH, PA-C  methocarbamol  (ROBAXIN ) 500 MG tablet Take 1 tablet (500 mg total) by mouth 2 (two) times daily. 08/05/20   Kehrli, Kelsey F, PA-C  methocarbamol  (ROBAXIN ) 500 MG tablet Take 1 tablet (500 mg total) by mouth 2 (two) times daily. 02/03/21   Aberman, Caroline C, PA-C  ondansetron  (ZOFRAN  ODT) 4 MG disintegrating tablet 4mg  ODT q4 hours prn nausea/vomit 03/04/20   Zammit, Joseph, MD  ondansetron  (ZOFRAN ) 4 MG tablet Take 1 tablet (4 mg total) by mouth every 6 (six) hours. 01/06/23   Prosperi, Christian H, PA-C  oxyCODONE (OXY IR/ROXICODONE) 5 MG immediate release  tablet Take 5 mg by mouth 4 (four) times daily. 02/18/20   [provider]  Phenyleph-CPM-DM-Aspirin (ALKA-SELTZER PLUS COLD & COUGH PO) Take 2 capsules by mouth every 4 (four) hours as needed (cold/cough).    [provider]  promethazine -codeine  (PHENERGAN  WITH CODEINE ) 6.25-10 MG/5ML syrup Take 5 mLs by mouth every 6 (six) hours as needed. 07/12/15   Armida Culver, PA-C    Allergies: Patient has no known allergies.    Review of Systems  Constitutional:  Negative for fever.   Musculoskeletal:  Positive for arthralgias. Negative for joint swelling and myalgias.  Neurological:  Negative for weakness and numbness.    Updated Vital Signs BP (!) 164/98 (BP Location: Right Arm)   Pulse 74   Temp 98.4 F (36.9 C) (Oral)   Resp 18   Ht 6' 1 (1.854 m)   Wt 120.2 kg   SpO2 100%   BMI 34.96 kg/m   Physical Exam Constitutional:      Appearance: He is well-developed.  HENT:     Head: Atraumatic.  Cardiovascular:     Comments: Pulses equal bilaterally Musculoskeletal:        General: Tenderness present.     Cervical back: Normal range of motion.     Right hip: Bony tenderness present. No deformity. Normal range of motion. Normal strength.     Comments: Tender to palpation right lateral hip just superior to the greater trochanter.  No palpable deformity, no hematoma, no induration.  No crepitus.  There is full range of motion both active and passive.  He does walk favoring the right leg however.  No knee pain no ankle pain.  Skin:    General: Skin is warm and dry.  Neurological:     Mental Status: He is alert.     Sensory: No sensory deficit.     Motor: No weakness.     Deep Tendon Reflexes: Reflexes normal.     (all labs ordered are listed, but only abnormal results are displayed) Labs Reviewed - No data to display  EKG: None  Radiology: No results found.  DG Hip Unilat W or Wo Pelvis 2-3 Views Right Result Date: 11/24/2024 CLINICAL DATA:  Right hip pain.  No known injury. EXAM: DG HIP (WITH OR WITHOUT PELVIS) 3V RIGHT COMPARISON:  None Available. FINDINGS: There is no evidence of hip fracture or dislocation. Mildly sclerotic appearance of the lateral right acetabular roof. Mild degenerative changes of the right hip. IMPRESSION: 1. Mild degenerative changes of the right hip. 2. Mildly sclerotic appearance of the lateral right acetabular roof, which may reflect sequela of femoroacetabular impingement. Electronically Signed   By: Limin  Xu M.D.   On:  11/24/2024 13:12     Procedures   Medications Ordered in the ED  HYDROcodone -acetaminophen  (NORCO/VICODIN) 5-325 MG per tablet 1 tablet (1 tablet Oral Given 11/24/24 1355)  naproxen  (NAPROSYN ) tablet 500 mg (500 mg Oral Given 11/24/24 1355)                                    Medical Decision Making Patient presenting with right hip pain gradually worsened over the past month, no injury.  Differential diagnosis including primary osteoarthritis, bursitis, soft tissue injury, I considered joint sepsis but exam does not suggest this finding, he can flex and extend the hip with minimal pain, his pain is worsened mainly with weightbearing.  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  This would be consistent with his exam findings.  He is placed on an anti-inflammatory, he was also given a short course of pain medications as he states he was sleeping last night secondary to this pain, he was cautioned regarding sedation regarding this medicine.  Patient will need follow-up with orthopedics, referral was given.  Amount and/or Complexity of Data Reviewed Radiology: ordered.    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  I agree with this interpretation and reviewed the films.  Risk Prescription drug management.        Final diagnoses:  Right hip pain    ED Discharge Orders          Ordered    HYDROcodone -acetaminophen  (NORCO/VICODIN) 5-325 MG tablet  Every 4 hours PRN        11/24/24 1334    naproxen  (NAPROSYN ) 500 MG tablet  2 times daily        11/24/24 1334               Britanie Harshman, PA-C 11/26/24 CLAIR Patsey Lot, MD 11/30/24 [phone]
